# Patient Record
Sex: Male | Born: 1980 | Race: Black or African American | Hispanic: No | Marital: Single | Smoking: Former smoker
Health system: Southern US, Community
[De-identification: ages and names within clinical notes are randomized; demographics above are authoritative.]

---

## 2000-01-28 ENCOUNTER — Encounter: Admission: RE | Admit: 2000-01-28 | Discharge: 2000-01-28 | Payer: Self-pay | Admitting: Internal Medicine

## 2001-08-04 ENCOUNTER — Encounter: Admission: RE | Admit: 2001-08-04 | Discharge: 2001-08-04 | Payer: Self-pay

## 2008-01-13 ENCOUNTER — Encounter (INDEPENDENT_AMBULATORY_CARE_PROVIDER_SITE_OTHER): Payer: Self-pay | Admitting: Internal Medicine

## 2008-01-13 ENCOUNTER — Ambulatory Visit: Payer: Self-pay | Admitting: Hospitalist

## 2008-01-13 DIAGNOSIS — L738 Other specified follicular disorders: Secondary | ICD-10-CM | POA: Insufficient documentation

## 2011-09-18 ENCOUNTER — Ambulatory Visit (INDEPENDENT_AMBULATORY_CARE_PROVIDER_SITE_OTHER): Payer: Self-pay

## 2011-09-18 DIAGNOSIS — L02219 Cutaneous abscess of trunk, unspecified: Secondary | ICD-10-CM

## 2011-09-20 ENCOUNTER — Ambulatory Visit (INDEPENDENT_AMBULATORY_CARE_PROVIDER_SITE_OTHER): Payer: Self-pay

## 2011-09-20 DIAGNOSIS — L02219 Cutaneous abscess of trunk, unspecified: Secondary | ICD-10-CM

## 2011-09-23 ENCOUNTER — Ambulatory Visit: Payer: Self-pay

## 2011-09-23 DIAGNOSIS — L02219 Cutaneous abscess of trunk, unspecified: Secondary | ICD-10-CM

## 2011-09-23 DIAGNOSIS — L03319 Cellulitis of trunk, unspecified: Secondary | ICD-10-CM

## 2011-09-25 ENCOUNTER — Ambulatory Visit (INDEPENDENT_AMBULATORY_CARE_PROVIDER_SITE_OTHER): Payer: Self-pay

## 2011-09-25 DIAGNOSIS — L03319 Cellulitis of trunk, unspecified: Secondary | ICD-10-CM

## 2011-09-25 DIAGNOSIS — L02219 Cutaneous abscess of trunk, unspecified: Secondary | ICD-10-CM

## 2011-09-27 ENCOUNTER — Ambulatory Visit (INDEPENDENT_AMBULATORY_CARE_PROVIDER_SITE_OTHER): Payer: Self-pay

## 2011-09-27 DIAGNOSIS — L02219 Cutaneous abscess of trunk, unspecified: Secondary | ICD-10-CM

## 2011-09-30 ENCOUNTER — Ambulatory Visit: Payer: Self-pay

## 2011-10-01 ENCOUNTER — Ambulatory Visit (INDEPENDENT_AMBULATORY_CARE_PROVIDER_SITE_OTHER): Payer: Self-pay | Admitting: Physician Assistant

## 2011-10-01 DIAGNOSIS — L03319 Cellulitis of trunk, unspecified: Secondary | ICD-10-CM

## 2011-10-01 DIAGNOSIS — L02219 Cutaneous abscess of trunk, unspecified: Secondary | ICD-10-CM

## 2011-10-03 ENCOUNTER — Ambulatory Visit (INDEPENDENT_AMBULATORY_CARE_PROVIDER_SITE_OTHER): Payer: Self-pay | Admitting: Physician Assistant

## 2011-10-03 DIAGNOSIS — L03319 Cellulitis of trunk, unspecified: Secondary | ICD-10-CM

## 2011-10-09 ENCOUNTER — Ambulatory Visit: Payer: Self-pay

## 2011-10-11 ENCOUNTER — Ambulatory Visit (INDEPENDENT_AMBULATORY_CARE_PROVIDER_SITE_OTHER): Payer: Self-pay

## 2011-10-11 DIAGNOSIS — L02219 Cutaneous abscess of trunk, unspecified: Secondary | ICD-10-CM

## 2011-10-14 ENCOUNTER — Ambulatory Visit (INDEPENDENT_AMBULATORY_CARE_PROVIDER_SITE_OTHER): Payer: Self-pay

## 2011-10-14 DIAGNOSIS — L03319 Cellulitis of trunk, unspecified: Secondary | ICD-10-CM

## 2011-10-16 ENCOUNTER — Ambulatory Visit (INDEPENDENT_AMBULATORY_CARE_PROVIDER_SITE_OTHER): Payer: Self-pay

## 2011-10-16 DIAGNOSIS — L03319 Cellulitis of trunk, unspecified: Secondary | ICD-10-CM

## 2011-10-16 DIAGNOSIS — L02219 Cutaneous abscess of trunk, unspecified: Secondary | ICD-10-CM

## 2011-10-18 ENCOUNTER — Ambulatory Visit (INDEPENDENT_AMBULATORY_CARE_PROVIDER_SITE_OTHER): Payer: Self-pay

## 2011-10-18 DIAGNOSIS — L02219 Cutaneous abscess of trunk, unspecified: Secondary | ICD-10-CM

## 2011-10-18 DIAGNOSIS — L03319 Cellulitis of trunk, unspecified: Secondary | ICD-10-CM

## 2011-10-21 ENCOUNTER — Ambulatory Visit (INDEPENDENT_AMBULATORY_CARE_PROVIDER_SITE_OTHER): Payer: Self-pay

## 2011-10-21 DIAGNOSIS — L03319 Cellulitis of trunk, unspecified: Secondary | ICD-10-CM

## 2011-10-23 ENCOUNTER — Ambulatory Visit (INDEPENDENT_AMBULATORY_CARE_PROVIDER_SITE_OTHER): Payer: Self-pay

## 2011-10-23 DIAGNOSIS — L03319 Cellulitis of trunk, unspecified: Secondary | ICD-10-CM

## 2011-10-23 DIAGNOSIS — L02219 Cutaneous abscess of trunk, unspecified: Secondary | ICD-10-CM

## 2011-10-26 ENCOUNTER — Ambulatory Visit (INDEPENDENT_AMBULATORY_CARE_PROVIDER_SITE_OTHER): Payer: Self-pay

## 2011-10-26 DIAGNOSIS — L03319 Cellulitis of trunk, unspecified: Secondary | ICD-10-CM

## 2011-10-26 DIAGNOSIS — L02219 Cutaneous abscess of trunk, unspecified: Secondary | ICD-10-CM

## 2011-10-29 ENCOUNTER — Ambulatory Visit: Payer: Self-pay | Admitting: Physician Assistant

## 2011-10-30 ENCOUNTER — Ambulatory Visit: Payer: Self-pay

## 2011-10-30 DIAGNOSIS — L02219 Cutaneous abscess of trunk, unspecified: Secondary | ICD-10-CM

## 2011-11-01 ENCOUNTER — Ambulatory Visit (INDEPENDENT_AMBULATORY_CARE_PROVIDER_SITE_OTHER): Payer: Self-pay

## 2011-11-01 DIAGNOSIS — L02219 Cutaneous abscess of trunk, unspecified: Secondary | ICD-10-CM

## 2011-11-05 ENCOUNTER — Ambulatory Visit (INDEPENDENT_AMBULATORY_CARE_PROVIDER_SITE_OTHER): Payer: Self-pay

## 2011-11-05 DIAGNOSIS — L02219 Cutaneous abscess of trunk, unspecified: Secondary | ICD-10-CM

## 2013-08-19 ENCOUNTER — Ambulatory Visit (INDEPENDENT_AMBULATORY_CARE_PROVIDER_SITE_OTHER): Payer: Managed Care, Other (non HMO) | Admitting: Physician Assistant

## 2013-08-19 VITALS — BP 134/74 | HR 100 | Temp 98.2°F | Resp 20 | Ht 68.75 in | Wt 262.8 lb

## 2013-08-19 DIAGNOSIS — L723 Sebaceous cyst: Secondary | ICD-10-CM

## 2013-08-19 DIAGNOSIS — L089 Local infection of the skin and subcutaneous tissue, unspecified: Secondary | ICD-10-CM

## 2013-08-19 MED ORDER — TRAMADOL HCL 50 MG PO TABS: 50.0000 mg | ORAL_TABLET | Freq: Three times a day (TID) | ORAL | Status: DC | PRN

## 2013-08-19 MED ORDER — CIPROFLOXACIN HCL 250 MG PO TABS: 250.0000 mg | ORAL_TABLET | Freq: Two times a day (BID) | ORAL | Status: DC

## 2013-08-19 NOTE — Progress Notes (Signed)
Subjective:    Patient ID: Cody Best, male    DOB: 01/16/1981, 32 y.o.   MRN: [id number]  HPI    Cody Best is a very pleasant 32 yr old male here complaining of an abscess at his mid left back.  He has had this area drained twice before - recurs in the same spot every time.  Has not gotten as bad as it previously has.  First noted symptoms 5-6 days ago, worsening last couple days.  Used a heating pad to the area last night.  Has felt worse today.  No fevers or chills (had this previously with abscess.)  No other involved areas.  Admits that he has had a bump in this spot for many years prior to abscess formation  On review of paper chart pt has had this area drained twice here - both were determined to be infected sebaceous cysts.  He unfortunately had a prolonged course with second I&D as he developed a secondary infection.  Previous cultures grew peptostreptococcus and coag neg staph.  Review of Systems  Constitutional: Positive for fatigue. Negative for fever and chills.  Respiratory: Negative.   Cardiovascular: Negative.   Gastrointestinal: Negative.   Musculoskeletal: Negative.   Skin: Positive for color change.  Neurological: Negative.        Objective:   Physical Exam  Vitals reviewed. Constitutional: He is oriented to person, place, and time. He appears well-developed and well-nourished. No distress.  HENT:  Head: Normocephalic and atraumatic.  Eyes: Conjunctivae are normal. No scleral icterus.  Pulmonary/Chest: Effort normal.  Neurological: He is alert and oriented to person, place, and time.  Skin: Skin is warm and dry.     Area of induration and erythema at left flank; moderately TTP; area of fluctuance under scar from previous drainage  Psychiatric: He has a normal mood and affect. His behavior is normal.    Procedure Note: Verbal consent obtained from the patient.  Local anesthesia with 2cc 2% plain lidocaine.  Betadine prep.  Incision with 11 blade over area of  maximal fluctuance.   Scant amount of sebaceous material expressed initially, but with deeper incision copious sebaceous material and purulence drained spontaneously.  There continued to be copious drainage of both purulence and sebaceous material with deep palpation.  Curved hemostats were used to break up loculations again facilitating copious drainage of sebaceous material.  Wound extensively explored breaking up loculations and removing sebaceous material.  Large pieces of the cyst wall were removed.  Irrigated with 5cc 2% plain lidocaine x 2, each time loosening further sebaceous material. Eventually I was unable to express anything further from the area and could not remove any further pieces of cyst wall.  The area was packed with 1/4" plain packing, cleansed, and dressed.  Pt tolerated very well.         Assessment & Plan:  Infected sebaceous cyst - Plan: Wound culture, ciprofloxacin (CIPRO) 250 MG tablet, traMADol (ULTRAM) 50 MG tablet   Cody Best is a very pleasant 32 yr old male here with an infected sebaceous cyst at his mid left back.  This is the third time he has had an infected sebaceous cyst in this location.  Area incised and drained as above.  Cx collected.  Cx from two previous I&D's have grown peptostreptococcus and coag neg staph - sensitive only to fluoroquinolones, clinda, gent, and rifampin.  He was previously successfully treated with cipro.  Because of this, will go ahead and start Cipro today.  I have given him a dose from our stock.  Tramadol prn pain.  Frequent hot compresses today and tomorrow.  Daily and prn dressing changes.  RTC 48 hours for wound care.  Fast track card given.  If sebaceous cyst recurs after this I&D, may have to consider surgery eval for wider/deeper excision.   Meds ordered this encounter  Medications  . ciprofloxacin (CIPRO) 250 MG tablet    Sig: Take 1 tablet (250 mg total) by mouth 2 (two) times daily.    Dispense:  14 tablet    Refill:  0     Order Specific Question:  Supervising Provider    Answer:  Ethelda Chick [[id number]]  . traMADol (ULTRAM) 50 MG tablet    Sig: Take 1 tablet (50 mg total) by mouth every 8 (eight) hours as needed.    Dispense:  30 tablet    Refill:  0    Order Specific Question:  Supervising Provider    Answer:  Ethelda Chick [[id number]]    Loleta Dicker MHS, PA-C [facility] 11/6/20144:16 PM

## 2013-08-19 NOTE — Patient Instructions (Signed)
Frequent hot compresses tonight and tomorrow.  Change the dressing at least once per day, more frequently if the dressing becomes wet or saturated.  Take the Cipro as directed.  Tramadol if needed for pain relief.   Follow up Saturday for wound care, sooner if concerns   Epidermal Cyst An epidermal cyst is sometimes called a sebaceous cyst, epidermal inclusion cyst, or infundibular cyst. These cysts usually contain a substance that looks "pasty" or "cheesy" and may have a bad smell. This substance is a protein called keratin. Epidermal cysts are usually found on the face, neck, or trunk. They may also occur in the vaginal area or other parts of the genitalia of both men and women. Epidermal cysts are usually small, painless, slow-growing bumps or lumps that move freely under the skin. It is important not to try to pop them. This may cause an infection and lead to tenderness and swelling. CAUSES  Epidermal cysts may be caused by a deep penetrating injury to the skin or a plugged hair follicle, often associated with acne. SYMPTOMS  Epidermal cysts can become inflamed and cause:  Redness.  Tenderness.  Increased temperature of the skin over the bumps or lumps.  Grayish-white, bad smelling material that drains from the bump or lump. DIAGNOSIS  Epidermal cysts are easily diagnosed by your caregiver during an exam. Rarely, a tissue sample (biopsy) may be taken to rule out other conditions that may resemble epidermal cysts. TREATMENT   Epidermal cysts often get better and disappear on their own. They are rarely ever cancerous.  If a cyst becomes infected, it may become inflamed and tender. This may require opening and draining the cyst. Treatment with antibiotics may be necessary. When the infection is gone, the cyst may be removed with minor surgery.  Small, inflamed cysts can often be treated with antibiotics or by injecting steroid medicines.  Sometimes, epidermal cysts become large and  bothersome. If this happens, surgical removal in your caregiver's office may be necessary. HOME CARE INSTRUCTIONS  Only take over-the-counter or prescription medicines as directed by your caregiver.  Take your antibiotics as directed. Finish them even if you start to feel better. SEEK MEDICAL CARE IF:   Your cyst becomes tender, red, or swollen.  Your condition is not improving or is getting worse.  You have any other questions or concerns. MAKE SURE YOU:  Understand these instructions.  Will watch your condition.  Will get help right away if you are not doing well or get worse. Document Released: 08/31/2004 Document Revised: 12/23/2011 Document Reviewed: 04/08/2011 [facility] Patient Information 2014 [place], [place].

## 2013-08-21 ENCOUNTER — Encounter: Payer: Self-pay | Admitting: Physician Assistant

## 2013-08-21 ENCOUNTER — Ambulatory Visit (INDEPENDENT_AMBULATORY_CARE_PROVIDER_SITE_OTHER): Payer: Managed Care, Other (non HMO) | Admitting: Physician Assistant

## 2013-08-21 VITALS — BP 128/82 | HR 89 | Temp 97.8°F | Resp 18 | Ht 68.75 in | Wt 262.0 lb

## 2013-08-21 DIAGNOSIS — L089 Local infection of the skin and subcutaneous tissue, unspecified: Secondary | ICD-10-CM

## 2013-08-21 DIAGNOSIS — L723 Sebaceous cyst: Secondary | ICD-10-CM

## 2013-08-21 NOTE — Progress Notes (Signed)
Patient ID: JACERE PANGBORN MRN: [id number], DOB: 23-Apr-1981 32 y.o. Date of Encounter: 08/21/2013, 11:58 AM  Chief Complaint: Wound care   See previous note  HPI: 32 y.o. y/o male presents for wound care s/p I&D on 08/19/13.  Doing well No issues or complaints Afebrile/ no chills No nausea or vomiting Tolerating Cipro Pain improved.  Daily dressing change Previous note reviewed  History reviewed. No pertinent past medical history.   Home Meds: Prior to Admission medications   Medication Sig Start Date End Date Taking? Authorizing Provider  ciprofloxacin (CIPRO) 250 MG tablet Take 1 tablet (250 mg total) by mouth 2 (two) times daily. 08/19/13  Yes Eleanore E Debbra Riding, PA-C  traMADol (ULTRAM) 50 MG tablet Take 1 tablet (50 mg total) by mouth every 8 (eight) hours as needed. 08/19/13   Eleanore Delia Chimes, PA-C    Allergies: No Known Allergies  ROS: Constitutional: Afebrile, no chills Cardiovascular: negative for chest pain or palpitations Dermatological: Positive for wound. Negative for erythema, pain, or warmth.  GI: No nausea or vomiting   EXAM: Physical Exam:  Blood pressure 128/82, pulse 89, temperature 97.8 F (36.6 C), temperature source Oral, resp. rate 18, height 5' 8.75" (1.746 m), weight 262 lb (118.842 kg), SpO2 98.00%., Body mass index is 38.98 kg/(m^2). General: Well developed, well nourished, in no acute distress. Nontoxic appearing. Head: Normocephalic, atraumatic, sclera non-icteric.  Neck: Supple. Lungs: Breathing is unlabored. Heart: Normal rate. Skin:  Warm and moist. Dressing and packing in place. No induration, erythema, or tenderness to palpation. Neuro: Alert and oriented X 3. Moves all extremities spontaneously. Normal gait.  Psych:  Responds to questions appropriately with a normal affect.       PROCEDURE: Dressing and packing removed. Moderate amount of purulence and sebaceous material expressed Wound bed healthy Irrigated with 1% plain lidocaine 5  cc. Repacked with 1/4 plain packing Dressing applied  LAB: Culture: pending  A/P: 32 y.o. y/o male with infected sebaceous cyst of back s/p I&D on 08/19/13.  Wound care per above Continue Cipro Pain well controlled Daily dressing changes Recheck 48 hours  Signed, Rhoderick Moody, PA-C 08/21/2013 11:58 AM

## 2013-08-22 LAB — WOUND CULTURE
Gram Stain: NONE SEEN
Gram Stain: NONE SEEN
Gram Stain: NONE SEEN

## 2013-08-23 ENCOUNTER — Ambulatory Visit (INDEPENDENT_AMBULATORY_CARE_PROVIDER_SITE_OTHER): Payer: Managed Care, Other (non HMO) | Admitting: Physician Assistant

## 2013-08-23 VITALS — BP 130/78 | HR 86 | Temp 98.0°F | Resp 16

## 2013-08-23 DIAGNOSIS — L089 Local infection of the skin and subcutaneous tissue, unspecified: Secondary | ICD-10-CM

## 2013-08-23 DIAGNOSIS — L723 Sebaceous cyst: Secondary | ICD-10-CM

## 2013-08-23 NOTE — Progress Notes (Signed)
  Subjective:    Patient ID: Cody Best, male    DOB: 08-16-1981, 32 y.o.   MRN: [id number]  HPI    Cody Best is a very pleasant 32 yr old male here for wound care following I&D of an infected sebaceous cyst on 08/19/13.  He reports that he is doing well.  He has much less pain.  The area continues to drain, but "not as much as I expected."  He is taking the antibiotics and tolerating them well.  Denies NV, FC.  He is changing the dressing daily.    Review of Systems  Constitutional: Negative for fever and chills.  Respiratory: Negative.   Cardiovascular: Negative.   Gastrointestinal: Negative.   Musculoskeletal: Negative.   Skin: Positive for wound.  Neurological: Negative.        Objective:   Physical Exam  Vitals reviewed. Constitutional: He is oriented to person, place, and time. He appears well-developed and well-nourished. No distress.  HENT:  Head: Normocephalic and atraumatic.  Eyes: Conjunctivae are normal. No scleral icterus.  Pulmonary/Chest: Effort normal.  Neurological: He is alert and oriented to person, place, and time.  Skin: Skin is warm and dry.     Healing wound at left flank; no erythema or induration; nontender  Psychiatric: He has a normal mood and affect. His behavior is normal.    Wound Care: Dressing and packing removed.  Packing saturated with drainage.  Irrigated with 10cc 1% plain lidocaine.  Small amount of sebaceous material and blood expressed.  No purulence.  Repacked with 1/4" plain packing.  Dressing applied.     Assessment & Plan:  Infected sebaceous cyst   Cody Best is a very pleasant 32 yr old male here for wound care following I&D of an infected sebaceous cyst.  The area has improved greatly since initial I&D 08/19/13.  Culture with no growth despite frank purulence with I&D.  Will continue abx.  Continue daily dressing changes.  Recheck 48 hours, fast track card updated.

## 2013-08-25 ENCOUNTER — Ambulatory Visit (INDEPENDENT_AMBULATORY_CARE_PROVIDER_SITE_OTHER): Payer: Managed Care, Other (non HMO) | Admitting: Physician Assistant

## 2013-08-25 VITALS — BP 120/78 | HR 78 | Temp 98.3°F | Resp 18 | Ht 68.75 in | Wt 262.0 lb

## 2013-08-25 DIAGNOSIS — L723 Sebaceous cyst: Secondary | ICD-10-CM

## 2013-08-25 DIAGNOSIS — L089 Local infection of the skin and subcutaneous tissue, unspecified: Secondary | ICD-10-CM

## 2013-08-25 NOTE — Progress Notes (Signed)
  Subjective:    Patient ID: Cody Best, male    DOB: June 09, 1981, 32 y.o.   MRN: [id number]  HPI    [name] is a very pleasant 32 yr old male here for wound care following I&D of an infected sebaceous cyst on 08/19/13.  He reports that he continues to do well.  He has much less pain.  The area continues to drain.  He is taking the antibiotics and tolerating them well.  Denies NV, FC.  He is changing the dressing daily.   Review of Systems  Constitutional: Negative for fever and chills.  Respiratory: Negative.   Cardiovascular: Negative.   Gastrointestinal: Negative.   Musculoskeletal: Negative.   Skin: Positive for wound.  Neurological: Negative.        Objective:   Physical Exam  Vitals reviewed. Constitutional: He is oriented to person, place, and time. He appears well-developed and well-nourished. No distress.  HENT:  Head: Normocephalic and atraumatic.  Eyes: Conjunctivae are normal. No scleral icterus.  Pulmonary/Chest: Effort normal.  Neurological: He is alert and oriented to person, place, and time.  Skin: Skin is warm and dry.     Healing wound at left flank; no erythema or induration; nontender  Psychiatric: He has a normal mood and affect. His behavior is normal.    Wound Care: Dressing and packing removed.  Packing saturated with drainage.  Irrigated with 5cc 1% plain lidocaine.  Scant amount of sebaceous material expresses along with one small piece of cyst wall.  No purulence.  Repacked loosely with 1/4" plain packing.  Dressing applied.     Assessment & Plan:  Infected sebaceous cyst  [name] is a very pleasant 32 yr old male here for wound care following I&D of an infected sebaceous cyst.  The area continues to improve.  Culture with no growth despite frank purulence with I&D.  Will finish abx.  Continue daily dressing changes.  Recheck 48 hours, fast track card updated.  Loleta Dicker MHS, PA-C [facility] 11/12/20144:27 PM

## 2013-08-27 ENCOUNTER — Encounter: Payer: Self-pay | Admitting: Physician Assistant

## 2013-08-27 ENCOUNTER — Ambulatory Visit (INDEPENDENT_AMBULATORY_CARE_PROVIDER_SITE_OTHER): Payer: Managed Care, Other (non HMO) | Admitting: Physician Assistant

## 2013-08-27 VITALS — BP 128/78 | HR 74 | Temp 98.2°F | Resp 16 | Ht 68.5 in | Wt 262.0 lb

## 2013-08-27 DIAGNOSIS — L723 Sebaceous cyst: Secondary | ICD-10-CM

## 2013-08-27 DIAGNOSIS — L089 Local infection of the skin and subcutaneous tissue, unspecified: Secondary | ICD-10-CM

## 2013-08-27 NOTE — Progress Notes (Signed)
  Subjective:    Patient ID: Cody Best, male    DOB: May 13, 1981, 32 y.o.   MRN: [id number]  HPI     Mr. Teems is a very pleasant 32 yr old male here for wound care following I&D of an infected sebaceous cyst on 08/19/13.  He reports that he continues to do well.  He has much less pain.  The area continues to drain.  He is taking the antibiotics and tolerating them well.  Denies NV, FC.  He is changing the dressing daily.   Review of Systems  Constitutional: Negative for fever and chills.  Respiratory: Negative.   Cardiovascular: Negative.   Gastrointestinal: Negative.   Musculoskeletal: Negative.   Skin: Positive for wound.  Neurological: Negative.        Objective:   Physical Exam  Vitals reviewed. Constitutional: He is oriented to person, place, and time. He appears well-developed and well-nourished. No distress.  HENT:  Head: Normocephalic and atraumatic.  Eyes: Conjunctivae are normal. No scleral icterus.  Pulmonary/Chest: Effort normal.  Neurological: He is alert and oriented to person, place, and time.  Skin: Skin is warm and dry.     Healing wound at left flank; no erythema or induration; nontender  Psychiatric: He has a normal mood and affect. His behavior is normal.    Wound Care: Dressing and packing removed.  Packing saturated with drainage. Scant amount of sebaceous material expressed along with one small piece of cyst wall.  No purulence.  Repacked loosely with 1/4" plain packing.  Dressing applied.     Assessment & Plan:  Infected sebaceous cyst  Mr. Oki is a very pleasant 32 yr old male here for wound care following I&D of an infected sebaceous cyst.  The area continues to improve.  I have repacked the area loosely today but do not think he will require further packing.  Will have him remove the packing on 08/30/13.  Continue daily dressing changes.  Keep area covered until completely healed.  RTC if concerns.  If sebaceous cyst recurs (which it has twice  before) discussed the possibility of excising it here or referral to surgery to evaluate excision.    Loleta Dicker MHS, PA-C [facility] 11/14/20146:53 PM

## 2013-10-02 ENCOUNTER — Telehealth: Payer: Self-pay

## 2013-10-02 DIAGNOSIS — L723 Sebaceous cyst: Secondary | ICD-10-CM

## 2013-10-02 NOTE — Telephone Encounter (Signed)
Cody Best - Pt has been seen several times for a recurring cyst.  He says the last person he saw told him that if it came back again that he would be referred.  It has came back.  Right now he can stand it.  I told him if it got unbearable to please come back to be seen.  Please call asap.  [phone]

## 2013-10-03 NOTE — Telephone Encounter (Signed)
He has to be seen to get referred? Please advise

## 2013-10-03 NOTE — Telephone Encounter (Signed)
Referral made. Return here for evaluation if acutely worsening before surgery appt

## 2013-10-03 NOTE — Telephone Encounter (Signed)
Spoke with pt, advised referral made. Pt understood.

## 2013-10-05 ENCOUNTER — Encounter (INDEPENDENT_AMBULATORY_CARE_PROVIDER_SITE_OTHER): Payer: Self-pay | Admitting: Surgery

## 2013-10-05 ENCOUNTER — Ambulatory Visit (INDEPENDENT_AMBULATORY_CARE_PROVIDER_SITE_OTHER): Payer: Managed Care, Other (non HMO) | Admitting: Surgery

## 2013-10-05 VITALS — BP 132/70 | HR 76 | Temp 98.0°F | Resp 18 | Ht 69.0 in | Wt 253.8 lb

## 2013-10-05 DIAGNOSIS — L720 Epidermal cyst: Secondary | ICD-10-CM

## 2013-10-05 DIAGNOSIS — L723 Sebaceous cyst: Secondary | ICD-10-CM

## 2013-10-05 NOTE — Progress Notes (Signed)
Chief Complaint:  Recurrent cyst on left back x 4  History of Present Illness:  Cody Best is an 32 y.o. male who has had a recurrent infected cyst on his left back [facility] at [facility] x 4.  Wants it excised.    No past medical history on file.  No past surgical history on file.  Current Outpatient Prescriptions  Medication Sig Dispense Refill  . ciprofloxacin (CIPRO) 250 MG tablet Take 1 tablet (250 mg total) by mouth 2 (two) times daily.  14 tablet  0  . traMADol (ULTRAM) 50 MG tablet Take 1 tablet (50 mg total) by mouth every 8 (eight) hours as needed.  30 tablet  0   No current facility-administered medications for this visit.   Review of patient's allergies indicates no known allergies. Family History  Problem Relation Age of Onset  . Diabetes Mother   . Hyperlipidemia Mother   . Heart disease Mother   . Diabetes Maternal Grandmother   . Hyperlipidemia Maternal Grandmother   . Heart disease Maternal Grandmother    Social History:   reports that he has been smoking.  He does not have any smokeless tobacco history on file. He reports that he drinks about 0.5 ounces of alcohol per week. He reports that he does not use illicit drugs.   REVIEW OF SYSTEMS - PERTINENT POSITIVES ONLY: No positives  Physical Exam:   Blood pressure 132/70, pulse 76, temperature 98 F (36.7 C), resp. rate 18, height 5\' 9"  (1.753 m), weight 253 lb 12.8 oz (115.123 kg). Body mass index is 37.46 kg/(m^2).  Gen:  WDWN AAM NAD  Neurological: Alert and oriented to person, place, and time. Motor and sensory function is grossly intact  Head: Normocephalic and atraumatic.  Eyes: Conjunctivae are normal. Pupils are equal, round, and reactive to light. No scleral icterus.  Neck: Normal range of motion. Neck supple. No tracheal deviation or thyromegaly present.   Pscyh: Normal mood and affect. Behavior is normal. Judgment and thought content normal.   Left back:  There is a grey nodule consisting of  scar over an old 2 cm palpable sebaceous cyst.    LABORATORY RESULTS: No results found for this or any previous visit (from the past 48 hour(s)).  RADIOLOGY RESULTS: No results found.  Problem List: Patient Active Problem List   Diagnosis Date Noted  . FOLLICULITIS 01/13/2008    Assessment & Plan: Sebaceous cyst-recurrent Excision under local in minor room at [facility]    Matt B. Daphine Deutscher, MD, [facility] [phone] beeper [phone]  10/05/2013 4:10 PM

## 2013-10-05 NOTE — Patient Instructions (Signed)

## 2013-11-08 ENCOUNTER — Telehealth (INDEPENDENT_AMBULATORY_CARE_PROVIDER_SITE_OTHER): Payer: Self-pay | Admitting: General Surgery

## 2013-11-08 NOTE — Telephone Encounter (Signed)
Pt called to ask about whether to proceed with surgery to removal a cyst.  With discussion, he admits this a recurrent cyst, but is currently dormant.  Encouraged pt to get the cyst removed as planned to avoid any further problems or flare-ups again. He agrees.

## 2013-11-12 ENCOUNTER — Encounter (HOSPITAL_BASED_OUTPATIENT_CLINIC_OR_DEPARTMENT_OTHER)
Admission: RE | Disposition: A | Discharge status: Home / Self Care | Payer: Self-pay | Source: Ambulatory Visit | Attending: Surgery

## 2013-11-12 ENCOUNTER — Encounter (HOSPITAL_BASED_OUTPATIENT_CLINIC_OR_DEPARTMENT_OTHER): Payer: Self-pay | Admitting: *Deleted

## 2013-11-12 ENCOUNTER — Ambulatory Visit (HOSPITAL_BASED_OUTPATIENT_CLINIC_OR_DEPARTMENT_OTHER)
Admission: RE | Admit: 2013-11-12 | Discharge: 2013-11-12 | Disposition: A | Discharge status: Home / Self Care | Payer: Managed Care, Other (non HMO) | Source: Ambulatory Visit | Attending: Surgery | Admitting: Surgery

## 2013-11-12 DIAGNOSIS — L723 Sebaceous cyst: Secondary | ICD-10-CM

## 2013-11-12 HISTORY — PX: CYST REMOVAL TRUNK: SHX6283

## 2013-11-12 SURGERY — CYST REMOVAL TRUNK
Anesthesia: LOCAL | Site: Back | Laterality: Left

## 2013-11-12 MED ORDER — CEFAZOLIN SODIUM-DEXTROSE 2-3 GM-% IV SOLR: 2.0000 g | INTRAVENOUS | Status: DC

## 2013-11-12 MED ORDER — SODIUM BICARBONATE 4 % IV SOLN
INTRAVENOUS | Status: DC | PRN
  Administered 2013-11-12: 5 mL via INTRAVENOUS

## 2013-11-12 MED ORDER — LIDOCAINE-EPINEPHRINE (PF) 1 %-1:200000 IJ SOLN
INTRAMUSCULAR | Status: DC | PRN
  Administered 2013-11-12: 10 mL

## 2013-11-12 MED ORDER — CHLORHEXIDINE GLUCONATE 4 % EX LIQD: 1.0000 | Freq: Once | CUTANEOUS | Status: DC

## 2013-11-12 SURGICAL SUPPLY — 27 items
BENZOIN TINCTURE PRP APPL 2/3 (GAUZE/BANDAGES/DRESSINGS) IMPLANT
BLADE SURG 15 STRL LF DISP TIS (BLADE) ×1 IMPLANT
BLADE SURG 15 STRL SS (BLADE) ×1
BLADE SURG ROTATE 9660 (MISCELLANEOUS) IMPLANT
DECANTER SPIKE VIAL GLASS SM (MISCELLANEOUS) ×2 IMPLANT
DERMABOND ADVANCED (GAUZE/BANDAGES/DRESSINGS) ×1
DERMABOND ADVANCED .7 DNX12 (GAUZE/BANDAGES/DRESSINGS) ×1 IMPLANT
ELECT REM PT RETURN 9FT ADLT (ELECTROSURGICAL) ×2
ELECTRODE REM PT RTRN 9FT ADLT (ELECTROSURGICAL) ×1 IMPLANT
GAUZE SPONGE 4X4 16PLY XRAY LF (GAUZE/BANDAGES/DRESSINGS) ×2 IMPLANT
GLOVE BIO SURGEON STRL SZ8 (GLOVE) ×2 IMPLANT
GLOVE ECLIPSE 6.5 STRL STRAW (GLOVE) ×2 IMPLANT
GLOVE ECLIPSE 7.0 STRL STRAW (GLOVE) ×2 IMPLANT
NEEDLE 27GAX1X1/2 (NEEDLE) IMPLANT
NEEDLE HYPO 25X1 1.5 SAFETY (NEEDLE) IMPLANT
PENCIL BUTTON HOLSTER BLD 10FT (ELECTRODE) ×2 IMPLANT
SPONGE GAUZE 4X4 12PLY STER LF (GAUZE/BANDAGES/DRESSINGS) ×2 IMPLANT
STRIP CLOSURE SKIN 1/2X4 (GAUZE/BANDAGES/DRESSINGS) IMPLANT
SUT ETHILON 3 0 FSL (SUTURE) IMPLANT
SUT ETHILON 5 0 PS 2 18 (SUTURE) IMPLANT
SUT VIC AB 4-0 SH 18 (SUTURE) IMPLANT
SUT VIC AB 5-0 PS2 18 (SUTURE) IMPLANT
SUT VICRYL 3-0 CR8 SH (SUTURE) ×2 IMPLANT
SWABSTICK POVIDONE IODINE SNGL (MISCELLANEOUS) ×4 IMPLANT
SYR CONTROL 10ML LL (SYRINGE) ×2 IMPLANT
TOWEL OR 17X24 6PK STRL BLUE (TOWEL DISPOSABLE) IMPLANT
UNDERPAD 30X30 INCONTINENT (UNDERPADS AND DIAPERS) IMPLANT

## 2013-11-12 NOTE — Op Note (Signed)
Surgeon: Wenda LowMatt Dotty Gonzalo, MD, FACS  Asst:  none  Anes:  Local 1% lido with epi and neut  Procedure: Excision of 2 cm chronically inflamed sebaceous cyst of the left upper back3  Diagnosis: Sebaceous cyst (path pending)  Complications: none  EBL:   3 cc  Description of Procedure:  In room 2 at [facility] the back was prepped with betadine and draped.  A timeout was performed.  Infiltration with 31 gauge needle provided good anesthesia (9cc).   A wide ellipse was described and this was taken deep to the deep fat/fascia interface in order to resect the cyst in toto.  This was done and the wound was closed in multiple layers with 3-0 vicryl and Dermabond.    Matt B. Daphine DeutscherMartin, MD, [facility]FACS [facility] [place] [phone]

## 2013-11-12 NOTE — Discharge Instructions (Signed)

## 2013-11-15 ENCOUNTER — Encounter (HOSPITAL_BASED_OUTPATIENT_CLINIC_OR_DEPARTMENT_OTHER): Payer: Self-pay | Admitting: Surgery

## 2013-11-16 ENCOUNTER — Telehealth (INDEPENDENT_AMBULATORY_CARE_PROVIDER_SITE_OTHER): Payer: Self-pay | Admitting: General Surgery

## 2013-11-16 NOTE — Telephone Encounter (Signed)
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  He had a cyst removed from back.  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  No core or weights for this time.  When he returns to core activity, be aware of any pain, sense of pulling or strain at the site and stop or lessen that activity.  He understands all and will comply.  He aunt is a Engineer, civil (consulting)nurse and is monitoring the surgical site for him.

## 2013-12-20 ENCOUNTER — Encounter (INDEPENDENT_AMBULATORY_CARE_PROVIDER_SITE_OTHER): Payer: Managed Care, Other (non HMO) | Admitting: Surgery

## 2013-12-23 ENCOUNTER — Encounter (INDEPENDENT_AMBULATORY_CARE_PROVIDER_SITE_OTHER): Payer: Managed Care, Other (non HMO) | Admitting: Surgery

## 2013-12-30 ENCOUNTER — Encounter (INDEPENDENT_AMBULATORY_CARE_PROVIDER_SITE_OTHER): Payer: Self-pay | Admitting: Surgery

## 2017-11-20 ENCOUNTER — Encounter: Payer: Self-pay | Admitting: Physician Assistant

## 2017-11-20 ENCOUNTER — Ambulatory Visit: Payer: Self-pay | Admitting: Physician Assistant

## 2017-11-20 VITALS — BP 117/76 | HR 91 | Temp 98.2°F | Resp 16 | Ht 68.5 in | Wt 236.0 lb

## 2017-11-20 DIAGNOSIS — Z024 Encounter for examination for driving license: Secondary | ICD-10-CM

## 2017-11-20 NOTE — Patient Instructions (Signed)
     IF you received an x-ray today, you will receive an invoice from [facility]. Please contact [facility] at [phone] with questions or concerns regarding your invoice.   IF you received labwork today, you will receive an invoice from [facility]. Please contact [facility] at [phone] with questions or concerns regarding your invoice.   Our billing staff will not be able to assist you with questions regarding bills from these companies.  You will be contacted with the lab results as soon as they are available. The fastest way to get your results is to activate your My Chart account. Instructions are located on the last page of this paperwork. If you have not heard from us regarding the results in 2 weeks, please contact this office.     

## 2017-11-25 ENCOUNTER — Other Ambulatory Visit: Payer: Self-pay

## 2017-11-25 ENCOUNTER — Encounter (HOSPITAL_COMMUNITY): Payer: Self-pay | Admitting: Emergency Medicine

## 2017-11-25 ENCOUNTER — Emergency Department (HOSPITAL_COMMUNITY): Payer: No Typology Code available for payment source

## 2017-11-25 ENCOUNTER — Emergency Department (HOSPITAL_COMMUNITY)
Admission: EM | Admit: 2017-11-25 | Discharge: 2017-11-26 | Disposition: A | Discharge status: Home / Self Care | Payer: No Typology Code available for payment source | Attending: Emergency Medicine | Admitting: Emergency Medicine

## 2017-11-25 DIAGNOSIS — F17210 Nicotine dependence, cigarettes, uncomplicated: Secondary | ICD-10-CM | POA: Diagnosis not present

## 2017-11-25 DIAGNOSIS — M545 Low back pain, unspecified: Secondary | ICD-10-CM

## 2017-11-25 DIAGNOSIS — M25511 Pain in right shoulder: Secondary | ICD-10-CM

## 2017-11-25 DIAGNOSIS — Z79899 Other long term (current) drug therapy: Secondary | ICD-10-CM | POA: Insufficient documentation
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
## 2017-11-26 MED ORDER — METHOCARBAMOL 500 MG PO TABS: 500.0000 mg | ORAL_TABLET | Freq: Two times a day (BID) | ORAL | 0 refills | Status: DC

## 2017-11-26 MED ORDER — NAPROXEN 375 MG PO TABS: 375.0000 mg | ORAL_TABLET | Freq: Two times a day (BID) | ORAL | 0 refills | Status: DC
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
## 2017-12-05 NOTE — Progress Notes (Signed)
[facility] [address], [place] [id number] [phone]  Date:  11/20/2017   Name:  Cody Best   DOB:  05/31/1981   MRN:  [id number]  PCP:  Patient, No Pcp Per    History of Present Illness:  Cody Best is a 37 y.o. male patient who presents to PCP with DOT  No concerns or complaints at this time  Patient Active Problem List   Diagnosis Date Noted  . FOLLICULITIS 01/13/2008    No past medical history on file.  Past Surgical History:  Procedure Laterality Date  . CYST REMOVAL TRUNK Left 11/12/2013   Procedure: MINOR CYST REMOVAL TRUNK;  Surgeon: Valarie MerinoMatthew B Martin, MD;  Location: [facility];  Service: General;  Laterality: Left;    Social History   Tobacco Use  . Smoking status: Current Every Day Smoker    Packs/day: 0.50    Types: Cigarettes  . Smokeless tobacco: Never Used  Substance Use Topics  . Alcohol use: Yes    Alcohol/week: 0.5 oz    Types: 1 Standard drinks or equivalent per week  . Drug use: No    Family History  Problem Relation Age of Onset  . Diabetes Mother   . Hyperlipidemia Mother   . Heart disease Mother   . Diabetes Maternal Grandmother   . Hyperlipidemia Maternal Grandmother   . Heart disease Maternal Grandmother     No Known Allergies  Medication list has been reviewed and updated.  Current Outpatient Medications on File Prior to Visit  Medication Sig Dispense Refill  . traMADol (ULTRAM) 50 MG tablet Take 1 tablet (50 mg total) by mouth every 8 (eight) hours as needed. (Patient not taking: Reported on 11/20/2017) 30 tablet 0   No current facility-administered medications on file prior to visit.     ROS ROS otherwise unremarkable unless listed above.  Physical Examination: BP 117/76   Pulse 91   Temp 98.2 F (36.8 C) (Oral)   Resp 16   Ht 5' 8.5" (1.74 m)   Wt 236 lb (107 kg)   SpO2 98%   BMI 35.36 kg/m  Ideal Body Weight: Weight in (lb) to have BMI = 25: 166.5  Physical Exam   Constitutional: He is oriented to person, place, and time. He appears well-developed and well-nourished. No distress.  HENT:  Head: Normocephalic and atraumatic.  Right Ear: Tympanic membrane, external ear and ear canal normal.  Left Ear: Tympanic membrane, external ear and ear canal normal.  Eyes: Conjunctivae and EOM are normal. Pupils are equal, round, and reactive to light.  Cardiovascular: Normal rate and regular rhythm. Exam reveals no friction rub.  No murmur heard. Pulmonary/Chest: Effort normal. No respiratory distress. He has no wheezes.  Abdominal: Soft. Bowel sounds are normal. He exhibits no distension and no mass. There is no tenderness. A hernia is present. Hernia confirmed negative in the right inguinal area and confirmed negative in the left inguinal area.  Musculoskeletal: Normal range of motion. He exhibits no edema or tenderness.  Neurological: He is alert and oriented to person, place, and time. He displays normal reflexes.  Skin: Skin is warm and dry. He is not diaphoretic.  Psychiatric: He has a normal mood and affect. His behavior is normal.     Assessment and Plan: Cody Best is a 37 y.o. 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

## 2017-12-09 ENCOUNTER — Ambulatory Visit: Payer: Self-pay | Admitting: Family Medicine

## 2018-01-21 ENCOUNTER — Encounter: Payer: Self-pay | Admitting: Physician Assistant

## 2019-06-09 ENCOUNTER — Ambulatory Visit
Admission: EM | Admit: 2019-06-09 | Discharge: 2019-06-09 | Disposition: A | Discharge status: Home / Self Care | Payer: Self-pay | Attending: Physician Assistant | Admitting: Physician Assistant

## 2019-06-09 DIAGNOSIS — L723 Sebaceous cyst: Secondary | ICD-10-CM

## 2019-06-09 DIAGNOSIS — L089 Local infection of the skin and subcutaneous tissue, unspecified: Secondary | ICD-10-CM

## 2019-06-09 MED ORDER — CEPHALEXIN 500 MG PO CAPS: 500.0000 mg | ORAL_CAPSULE | Freq: Four times a day (QID) | ORAL | 0 refills | Status: DC

## 2019-06-09 NOTE — Discharge Instructions (Signed)
Start keflex as directed. You can remove current dressing in 24 hours. Keep wound clean and dry. You can clean gently with soap and water. Do not soak area in water. Monitor for spreading redness, increased warmth, increased swelling, fever, follow up for reevaluation needed. 

## 2019-06-09 NOTE — ED Provider Notes (Signed)
[facility]    CSN: [id number] Arrival date & time: 06/09/19  1912      History   Chief Complaint Chief Complaint  Patient presents with  . Abscess    HPI Cody Best is a 38 y.o. male.   38 year old male comes in for 4-5 day history of abscess to the right mid back. He had history of cyst to the same area and was removed surgically 3 years ago. Few months ago noticed the cyst returning, though smaller in size. Now with redness, warmth, and area tender to touch. Unknown spreading due to location. No fever, chills, body aches. Has not taken anything for the symptoms.      History reviewed. No pertinent past medical history.  Patient Active Problem List   Diagnosis Date Noted  . FOLLICULITIS 63/78/5885    Past Surgical History:  Procedure Laterality Date  . CYST REMOVAL TRUNK Left 11/12/2013   Procedure: MINOR CYST REMOVAL TRUNK;  Surgeon: Pedro Earls, MD;  Location: [facility] [place];  Service: General;  Laterality: Left;       Home Medications    Prior to Admission medications   Medication Sig Start Date End Date Taking? Authorizing Provider  cephALEXin (KEFLEX) 500 MG capsule Take 1 capsule (500 mg total) by mouth 4 (four) times daily. 06/09/19   Ok Edwards, PA-C    Family History Family History  Problem Relation Age of Onset  . Diabetes Mother   . Hyperlipidemia Mother   . Heart disease Mother   . Diabetes Maternal Grandmother   . Hyperlipidemia Maternal Grandmother   . Heart disease Maternal Grandmother     Social History Social History   Tobacco Use  . Smoking status: Current Every Day Smoker    Packs/day: 0.50    Types: Cigarettes  . Smokeless tobacco: Never Used  Substance Use Topics  . Alcohol use: Yes    Alcohol/week: 1.0 standard drinks    Types: 1 Standard drinks or equivalent per week  . Drug use: No     Allergies   Patient has no known allergies.   Review of Systems Review of Systems  Reason unable to  perform ROS: See HPI as above.     Physical Exam Triage Vital Signs ED Triage Vitals [06/09/19 1920]  Enc Vitals Group     BP (!) 140/92     Pulse Rate 95     Resp 18     Temp 98.6 F (37 C)     Temp Source Oral     SpO2 97 %     Weight      Height      Head Circumference      Peak Flow      Pain Score 6     Pain Loc      Pain Edu?      Excl. in [place]?    No data found.  Updated Vital Signs BP (!) 140/92 (BP Location: Left Arm)   Pulse 95   Temp 98.6 F (37 C) (Oral)   Resp 18   SpO2 97%   Physical Exam Constitutional:      General: He is not in acute distress.    Appearance: He is well-developed. He is not diaphoretic.  HENT:     Head: Normocephalic and atraumatic.  Eyes:     Conjunctiva/sclera: Conjunctivae normal.     Pupils: Pupils are equal, round, and reactive to light.  Pulmonary:  Effort: Pulmonary effort is normal. No respiratory distress.  Skin:    General: Skin is warm and dry.     Comments: 6cm x 5cm abscess to the right mid back with surrounding cellulitis. Old surgical scar to the right of the abscess noted.   Neurological:     Mental Status: He is alert and oriented to person, place, and time.      UC Treatments / Results  Labs (all labs ordered are listed, but only abnormal results are displayed) Labs Reviewed - No data to display  EKG   Radiology No results found.  Procedures Incision and Drainage  Date/Time: 06/09/2019 7:56 PM Performed by: Belinda FisherYu, Amy V, PA-C Authorized by: Belinda FisherYu, Amy V, PA-C   Consent:    Consent obtained:  Verbal   Consent given by:  Patient   Risks discussed:  Bleeding, incomplete drainage, pain, damage to other organs and infection   Alternatives discussed:  Alternative treatment and referral Location:    Type:  Abscess   Size:  6cm x 5cm   Location:  Trunk   Trunk location:  Back Pre-procedure details:    Skin preparation:  Chloraprep Anesthesia (see MAR for exact dosages):    Anesthesia method:   Local infiltration   Local anesthetic:  Lidocaine 2% WITH epi Procedure type:    Complexity:  Simple Procedure details:    Needle aspiration: no     Incision types:  Single straight   Incision depth:  Dermal   Scalpel blade:  11   Wound management:  Probed and deloculated and irrigated with saline   Drainage:  Purulent   Drainage amount:  Copious   Wound treatment:  Wound left open   Packing materials:  None Post-procedure details:    Patient tolerance of procedure:  Tolerated well, no immediate complications   (including critical care time)  Medications Ordered in UC Medications - No data to display  Initial Impression / Assessment and Plan / UC Course  I have reviewed the triage vital signs and the nursing notes.  Pertinent labs & imaging results that were available during my care of the patient were reviewed by me and considered in my medical decision making (see chart for details).    Patient tolerated procedure well. Start keflex for surrounding cellulitis. Wound care instructions given. Return precautions given. Patient expresses understanding and agrees to plan.    Final Clinical Impressions(s) / UC Diagnoses   Final diagnoses:  Infected sebaceous cyst    ED Prescriptions    Medication Sig Dispense Auth. Provider   cephALEXin (KEFLEX) 500 MG capsule Take 1 capsule (500 mg total) by mouth 4 (four) times daily. 28 capsule Threasa AlphaYu, Amy V, PA-C       Yu, Amy V, [place]-C 06/09/19 [id number]

## 2019-06-09 NOTE — ED Triage Notes (Signed)
Pt c/o abscess to rt mid back area for 4-5 days. States has had surgery to remove it 3 yrs ago. Raised red area noted with tender to touch

## 2019-08-19 ENCOUNTER — Emergency Department (HOSPITAL_COMMUNITY): Payer: Self-pay

## 2019-08-19 ENCOUNTER — Encounter (HOSPITAL_COMMUNITY): Payer: Self-pay | Admitting: Emergency Medicine

## 2019-08-19 ENCOUNTER — Emergency Department (HOSPITAL_COMMUNITY)
Admission: EM | Admit: 2019-08-19 | Discharge: 2019-08-19 | Disposition: A | Discharge status: Home / Self Care | Payer: Self-pay | Attending: Emergency Medicine | Admitting: Emergency Medicine

## 2019-08-19 ENCOUNTER — Other Ambulatory Visit: Payer: Self-pay

## 2019-08-19 DIAGNOSIS — R079 Chest pain, unspecified: Secondary | ICD-10-CM

## 2019-08-19 DIAGNOSIS — Z6837 Body mass index (BMI) 37.0-37.9, adult: Secondary | ICD-10-CM | POA: Insufficient documentation

## 2019-08-19 DIAGNOSIS — R519 Headache, unspecified: Secondary | ICD-10-CM | POA: Insufficient documentation

## 2019-08-19 DIAGNOSIS — R0789 Other chest pain: Secondary | ICD-10-CM | POA: Insufficient documentation

## 2019-08-19 DIAGNOSIS — E669 Obesity, unspecified: Secondary | ICD-10-CM | POA: Insufficient documentation

## 2019-08-19 DIAGNOSIS — R531 Weakness: Secondary | ICD-10-CM | POA: Insufficient documentation

## 2019-08-19 DIAGNOSIS — R0602 Shortness of breath: Secondary | ICD-10-CM

## 2019-08-19 DIAGNOSIS — U071 COVID-19: Secondary | ICD-10-CM | POA: Insufficient documentation

## 2019-08-19 DIAGNOSIS — F17210 Nicotine dependence, cigarettes, uncomplicated: Secondary | ICD-10-CM | POA: Insufficient documentation

## 2019-08-19 LAB — BASIC METABOLIC PANEL
Anion gap: 11 (ref 5–15)
BUN: 8 mg/dL (ref 6–20)
CO2: 22 mmol/L (ref 22–32)
Calcium: 9.5 mg/dL (ref 8.9–10.3)
Chloride: 104 mmol/L (ref 98–111)
Creatinine, Ser: 1.38 mg/dL — ABNORMAL HIGH (ref 0.61–1.24)
GFR calc Af Amer: >60 mL/min (ref >60)
GFR calc non Af Amer: >60 mL/min (ref >60)
Glucose, Bld: 127 mg/dL — ABNORMAL HIGH (ref 70–99)
Potassium: 3.5 mmol/L (ref 3.5–5.1)
Sodium: 137 mmol/L (ref 135–145)

## 2019-08-19 LAB — TROPONIN I (HIGH SENSITIVITY)
Troponin I (High Sensitivity): 7 ng/L (ref <18)
Troponin I (High Sensitivity): 7 ng/L (ref <18)

## 2019-08-19 LAB — CBC
HCT: 47.1 % (ref 39.0–52.0)
Hemoglobin: 16.2 g/dL (ref 13.0–17.0)
MCH: 30.3 pg (ref 26.0–34.0)
MCHC: 34.4 g/dL (ref 30.0–36.0)
MCV: 88.2 fL (ref 80.0–100.0)
Platelets: 202 10*3/uL (ref 150–400)
RBC: 5.34 MIL/uL (ref 4.22–5.81)
RDW: 13.2 % (ref 11.5–15.5)
WBC: 8.4 10*3/uL (ref 4.0–10.5)
nRBC: 0 % (ref 0.0–0.2)

## 2019-08-19 LAB — D-DIMER, QUANTITATIVE: D-Dimer, Quant: <0.27 ug/mL-FEU (ref 0.00–0.50)

## 2019-08-19 MED ORDER — BENZONATATE 100 MG PO CAPS: 100.0000 mg | ORAL_CAPSULE | Freq: Three times a day (TID) | ORAL | 0 refills | Status: DC

## 2019-08-19 MED ORDER — IOHEXOL 350 MG/ML SOLN
100.0000 mL | Freq: Once | INTRAVENOUS | Status: AC | PRN
  Administered 2019-08-19: 100 mL via INTRAVENOUS

## 2019-08-19 MED ORDER — SODIUM CHLORIDE 0.9% FLUSH: 3.0000 mL | Freq: Once | INTRAVENOUS | Status: DC

## 2019-08-19 NOTE — ED Notes (Signed)
Patient verbalizes understanding of discharge instructions. Opportunity for questioning and answers were provided. Armband removed by staff, pt discharged from ED ambulatory to home.  

## 2019-08-19 NOTE — Discharge Instructions (Signed)
There is no evidence of heart attack or blood clot in the lung. Your chest pain is likely secondary to your coughing and illness. As we discussed, keep yourself quarantined at home while you are feeling ill. Return to the ED if you develop new or worsening symptoms.

## 2019-08-19 NOTE — ED Provider Notes (Signed)
Ct scan without acute findings.  Pt without oxygen requirement.  Appears stable for continued outpatient management.  Warning signs , precautions discussed.    Dorie Rank, MD 08/19/19 [place]ly [place]

## 2019-08-19 NOTE — ED Provider Notes (Signed)
[facility] Provider Note   CSN: [id number] Arrival date & time: 08/19/19  1125     History   Chief Complaint Chief Complaint  Patient presents with  . Chest Pain    positive COVID    HPI Lyda JesterBryan E Pantoja is a 38 y.o. male.     Patient reports tested for COVID 2 days ago due to exposure and tested positive today. He did not have any symptoms initially but now has had R sided chest pain for the past 2 days that is intermittent but persistent. Pain lasts for several hours at a time, better with some position changes. Does not radiate, somewhat worse with deep breathing. Denies any SOB. Some cough, chills, subjective fever. Chest pain is not exertional. No CAD history. Does smoke tobacco but no drugs. Also has worsening fatigue and headache. No chest pain currently but does have some pain with breathing.   The history is provided by the patient.  Chest Pain Associated symptoms: cough, fatigue, fever, headache, shortness of breath and weakness   Associated symptoms: no abdominal pain, no nausea and no vomiting     History reviewed. No pertinent past medical history.  Patient Active Problem List   Diagnosis Date Noted  . FOLLICULITIS 01/13/2008    Past Surgical History:  Procedure Laterality Date  . CYST REMOVAL TRUNK Left 11/12/2013   Procedure: MINOR CYST REMOVAL TRUNK;  Surgeon: Valarie MerinoMatthew B Martin, MD;  Location: [facility];  Service: General;  Laterality: Left;        Home Medications    Prior to Admission medications   Medication Sig Start Date End Date Taking? Authorizing Provider  cephALEXin (KEFLEX) 500 MG capsule Take 1 capsule (500 mg total) by mouth 4 (four) times daily. 06/09/19   Belinda FisherYu, Amy V, PA-C    Family History Family History  Problem Relation Age of Onset  . Diabetes Mother   . Hyperlipidemia Mother   . Heart disease Mother   . Diabetes Maternal Grandmother   . Hyperlipidemia Maternal Grandmother   .  Heart disease Maternal Grandmother     Social History Social History   Tobacco Use  . Smoking status: Current Every Day Smoker    Packs/day: 0.50    Types: Cigarettes  . Smokeless tobacco: Never Used  Substance Use Topics  . Alcohol use: Yes    Alcohol/week: 1.0 standard drinks    Types: 1 Standard drinks or equivalent per week  . Drug use: No     Allergies   Patient has no known allergies.   Review of Systems Review of Systems  Constitutional: Positive for chills, fatigue and fever.  Respiratory: Positive for cough, chest tightness and shortness of breath.   Cardiovascular: Positive for chest pain.  Gastrointestinal: Negative for abdominal pain, nausea and vomiting.  Genitourinary: Negative for dysuria and hematuria.  Musculoskeletal: Positive for arthralgias and myalgias.  Skin: Negative for rash.  Neurological: Positive for weakness and headaches.   all other systems are negative except as noted in the HPI and PMH.     Physical Exam Updated Vital Signs BP 137/69   Pulse (!) 101   Temp 99.1 F (37.3 C) (Oral)   Resp (!) 21   Ht 5\' 9"  (1.753 m)   Wt 115.7 kg   SpO2 96%   BMI 37.66 kg/m   Physical Exam Vitals signs and nursing note reviewed.  Constitutional:      General: He is not in acute distress.  Appearance: He is well-developed. He is obese. He is not ill-appearing.  HENT:     Head: Normocephalic and atraumatic.     Mouth/Throat:     Pharynx: No oropharyngeal exudate.  Eyes:     Conjunctiva/sclera: Conjunctivae normal.     Pupils: Pupils are equal, round, and reactive to light.  Neck:     Musculoskeletal: Normal range of motion and neck supple.     Comments: No meningismus. Cardiovascular:     Rate and Rhythm: Normal rate and regular rhythm.     Heart sounds: Normal heart sounds. No murmur.  Pulmonary:     Effort: Pulmonary effort is normal. No respiratory distress.     Breath sounds: Normal breath sounds.     Comments: No chest wall  tenderness Chest:     Chest wall: No tenderness.  Abdominal:     Palpations: Abdomen is soft.     Tenderness: There is no abdominal tenderness. There is no guarding or rebound.  Musculoskeletal: Normal range of motion.        General: No tenderness.  Skin:    General: Skin is warm.     Capillary Refill: Capillary refill takes less than 2 seconds.  Neurological:     General: No focal deficit present.     Mental Status: He is alert and oriented to person, place, and time. Mental status is at baseline.     Cranial Nerves: No cranial nerve deficit.     Motor: No abnormal muscle tone.     Coordination: Coordination normal.     Comments: No ataxia on finger to nose bilaterally. No pronator drift. 5/5 strength throughout. CN 2-12 intact.Equal grip strength. Sensation intact.   Psychiatric:        Behavior: Behavior normal.      ED Treatments / Results  Labs (all labs ordered are listed, but only abnormal results are displayed) Labs Reviewed  BASIC METABOLIC PANEL - Abnormal; Notable for the following components:      Result Value   Glucose, Bld 127 (*)    Creatinine, Ser 1.38 (*)    All other components within normal limits  CBC  D-DIMER, QUANTITATIVE (NOT AT [facility])  TROPONIN I (HIGH SENSITIVITY)  TROPONIN I (HIGH SENSITIVITY)    EKG EKG Interpretation  Date/Time:  Thursday August 19 2019 11:34:19 EST Ventricular Rate:  104 PR Interval:  168 QRS Duration: 82 QT Interval:  340 QTC Calculation: 447 R Axis:   82 Text Interpretation: Sinus tachycardia T wave abnormality, consider inferior ischemia Abnormal ECG No previous ECGs available Confirmed by Glynn Octave [phone]) on 08/19/2019 12:07:58 PM   Radiology Dg Chest Port 1 View  Result Date: 08/19/2019 CLINICAL DATA:  Chest pain.  COVID-19 positive EXAM: PORTABLE CHEST 1 VIEW COMPARISON:  None. FINDINGS: Lungs are clear. Heart size and pulmonary vascularity are normal. No adenopathy. No bone lesions. IMPRESSION: No  edema or consolidation.  No evident adenopathy. Electronically Signed   By: Bretta Bang III M.D.   On: 08/19/2019 12:30    Procedures Procedures (including critical care time)  Medications Ordered in ED Medications  sodium chloride flush (NS) 0.9 % injection 3 mL (3 mLs Intravenous Not Given 08/19/19 1159)     Initial Impression / Assessment and Plan / ED Course  I have reviewed the triage vital signs and the nursing notes.  Pertinent labs & imaging results that were available during my care of the patient were reviewed by me and considered in my medical decision making (  see chart for details).       Patient with known coronavirus positive test presenting with right-sided chest pain that he has been having for the past 2 days.  The pain is not necessarily exertional but is pleuritic and last several hours at a time.  He denies any significant shortness of breath, cough or fever.  His EKG has T wave inversions with no comparison.  Chest x-ray is negative for pneumonia or pneumothorax or rib fracture.  Suspect likely musculoskeletal chest pain that is not reproducible.  Patient mildly tachypneic and tachycardic.  D-dimer is negative however given his coronavirus status will proceed with CT PE.  Low suspicion for ACS.  Ongoing chest pain for several days with negative troponins.  Feel patient can be discharged if his CT scan is negative.  He knows to maintain quarantine precautions at home.  Dr. Tomi Bamberger to assume care at shift change.  ELSIE SAKUMA was evaluated in Emergency Department on 08/19/2019 for the symptoms described in the history of present illness. He was evaluated in the context of the global COVID-19 pandemic, which necessitated consideration that the patient might be at risk for infection with the SARS-CoV-2 virus that causes COVID-19. Institutional protocols and algorithms that pertain to the evaluation of patients at risk for COVID-19 are in a state of rapid change  based on information released by regulatory bodies including the CDC and federal and state organizations. These policies and algorithms were followed during the patient's care in the ED.    Final Clinical Impressions(s) / ED Diagnoses   Final diagnoses:  Atypical chest pain  COVID-19 virus detected    ED Discharge Orders    None       Dorena Dorfman, Annie Main, MD 08/19/19 1538

## 2019-08-19 NOTE — ED Triage Notes (Signed)
Pt states he was just told he had a positive COVID 19 test. Pt states he has been having cp for the past 2 days. Also complains of fatigue.

## 2019-09-21 ENCOUNTER — Other Ambulatory Visit: Payer: Self-pay

## 2019-09-21 DIAGNOSIS — Z20822 Contact with and (suspected) exposure to covid-19: Secondary | ICD-10-CM

## 2019-09-23 LAB — NOVEL CORONAVIRUS, NAA: SARS-CoV-2, NAA: NOT DETECTED

## 2021-03-14 IMAGING — CT CT ANGIO CHEST
2 of 6 series · 18 of 46 positions shown · IV contrast (omnipaque)
Comparison: Chest x-ray from same day.

CLINICAL DATA: Right-sided chest pain for the past 2 days. [id number]
positive.

EXAM:
CT ANGIOGRAPHY CHEST WITH CONTRAST
TECHNIQUE: Multidetector CT imaging of the chest was performed using the
standard protocol during bolus administration of intravenous
contrast. Multiplanar CT image reconstructions and MIPs were
obtained to evaluate the vascular anatomy.
CONTRAST:  100mL OMNIPAQUE IOHEXOL 350 MG/ML SOLN

[Series 7: thins · axial · 0.79mm/px · z∈[-416,-167]mm · 15 of 273 slices shown]
[im 12/273  lung]
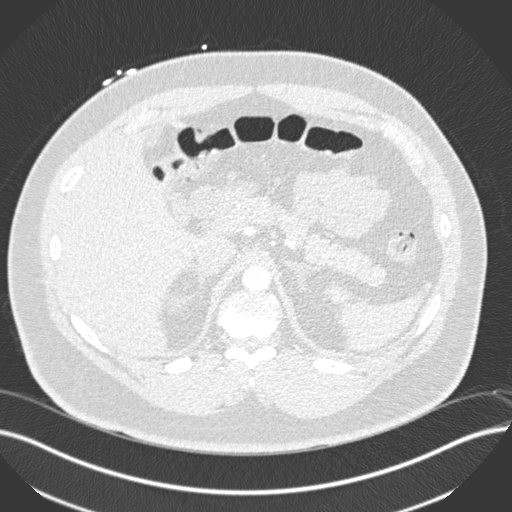
[im 36/273  soft-tissue]
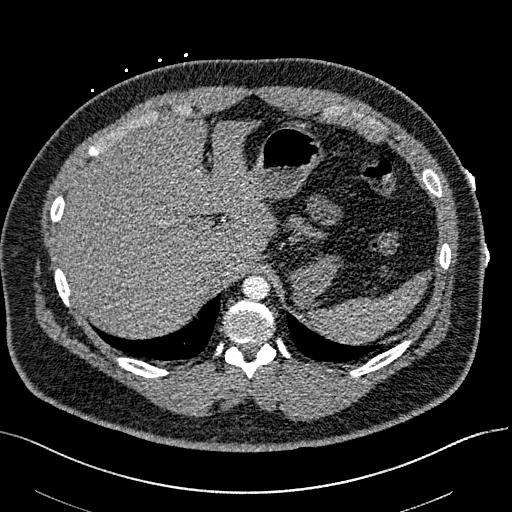
[im 48/273  lung]
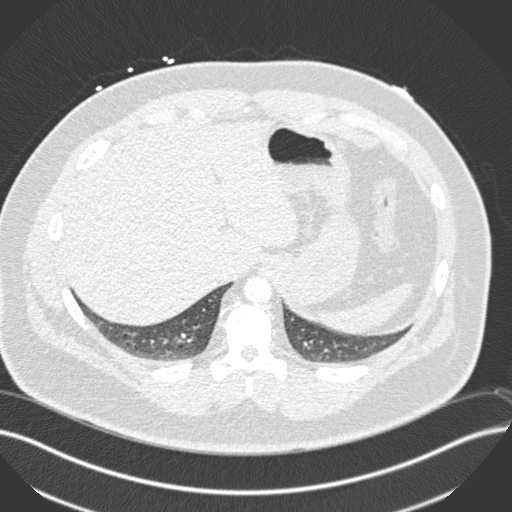
[im 71/273  soft-tissue]
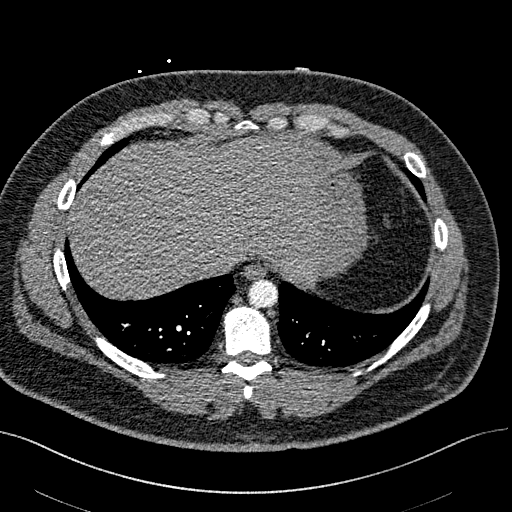
[im 83/273  lung]
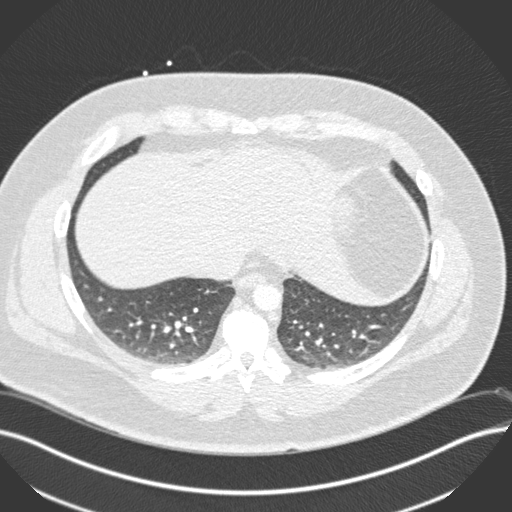
[im 107/273  soft-tissue]
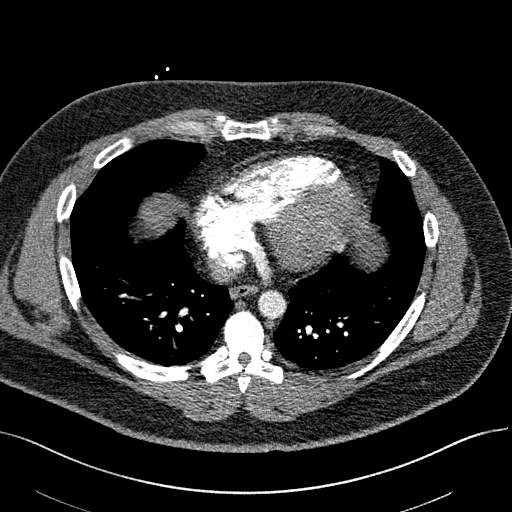
[im 119/273  lung]
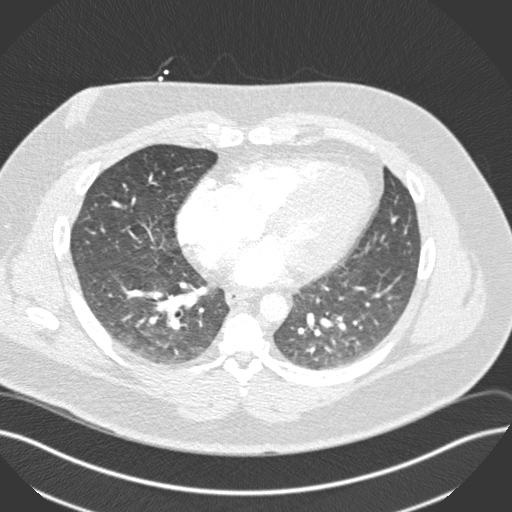
[im 142/273  soft-tissue]
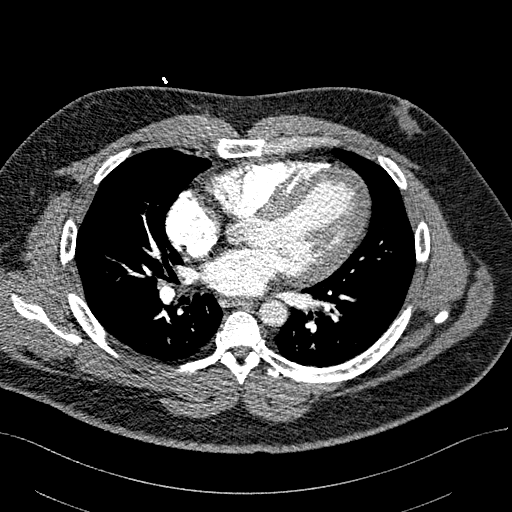
[im 154/273  lung]
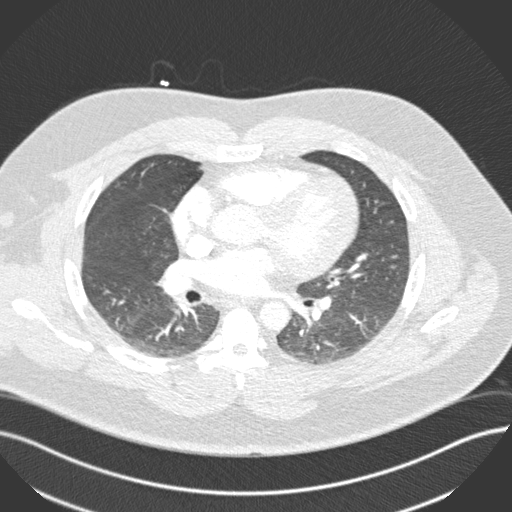
[im 166/273  soft-tissue]
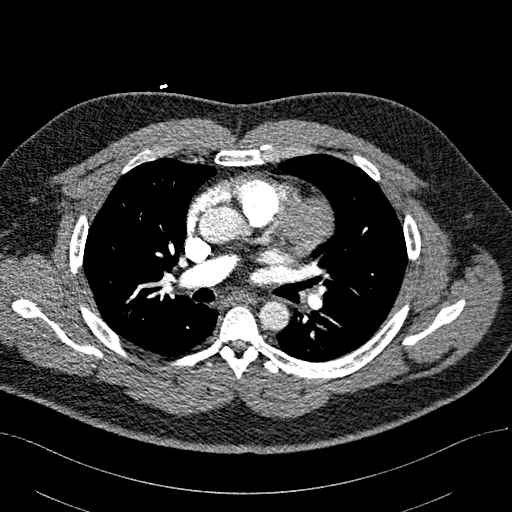
[im 190/273  lung]
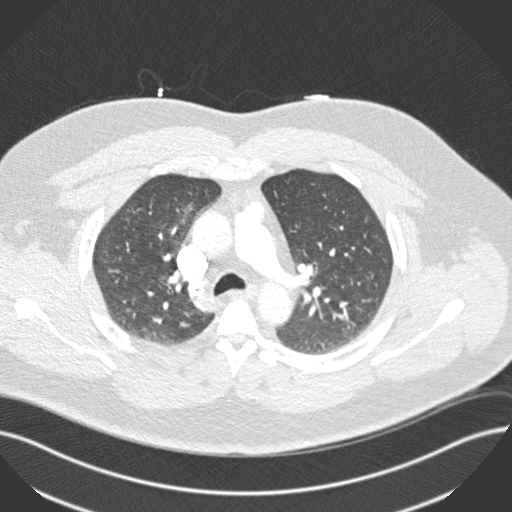
[im 202/273  soft-tissue]
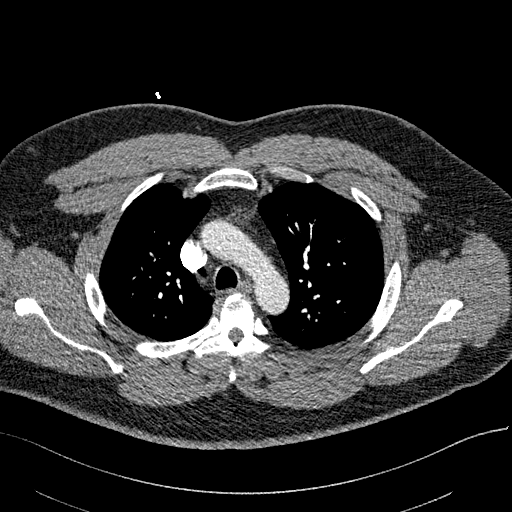
[im 225/273  lung]
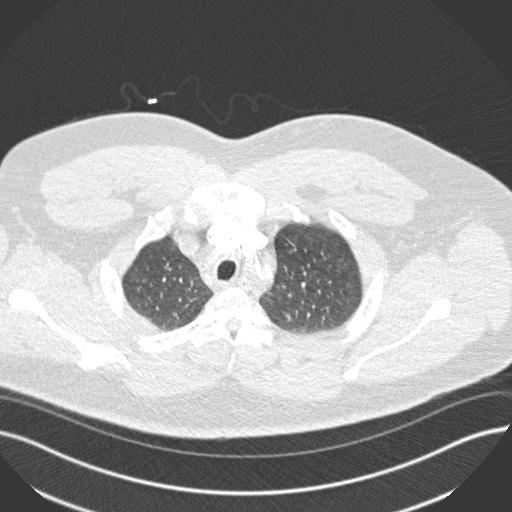
[im 237/273  soft-tissue]
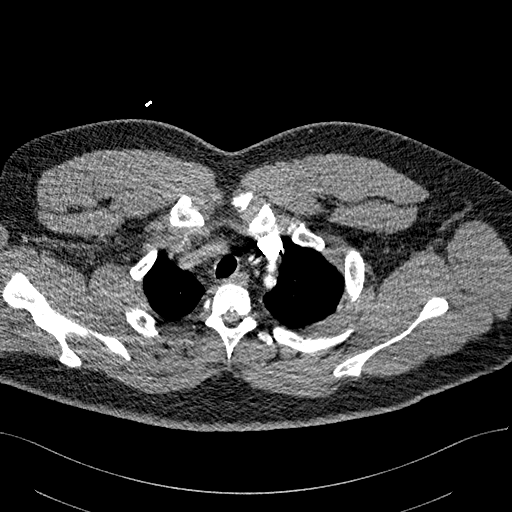
[im 261/273  lung]
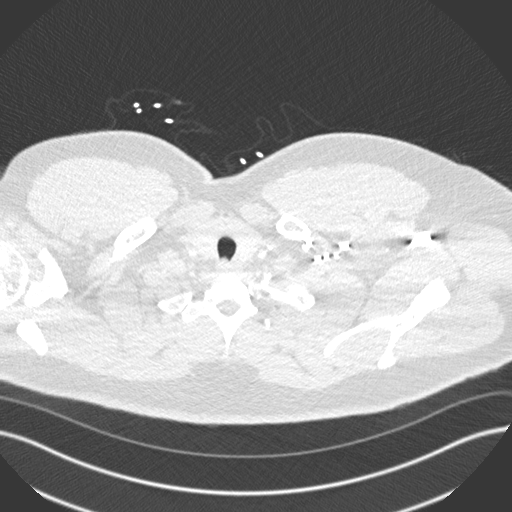

[Series 9: coronal mpr · coronal · 0.55mm/px · 3 of 173 slices shown]
[im 44/173  soft-tissue]
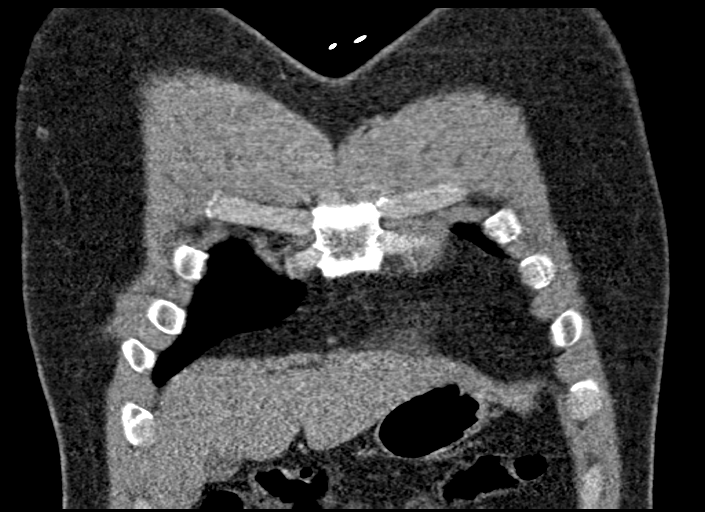
[im 87/173  soft-tissue]
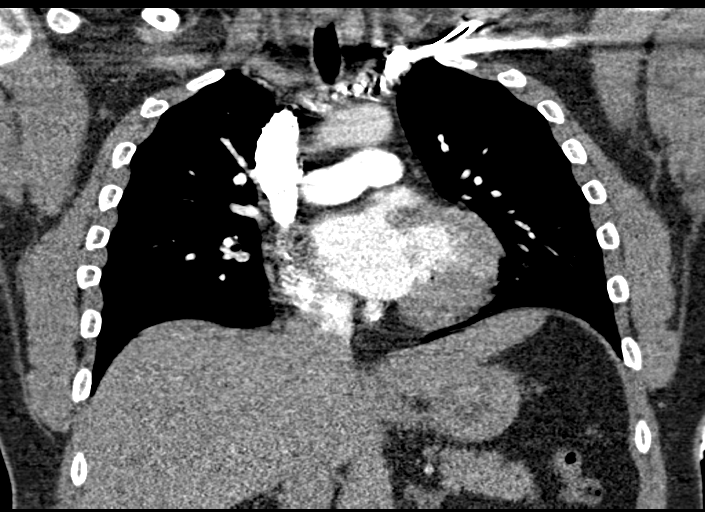
[im 130/173  soft-tissue]
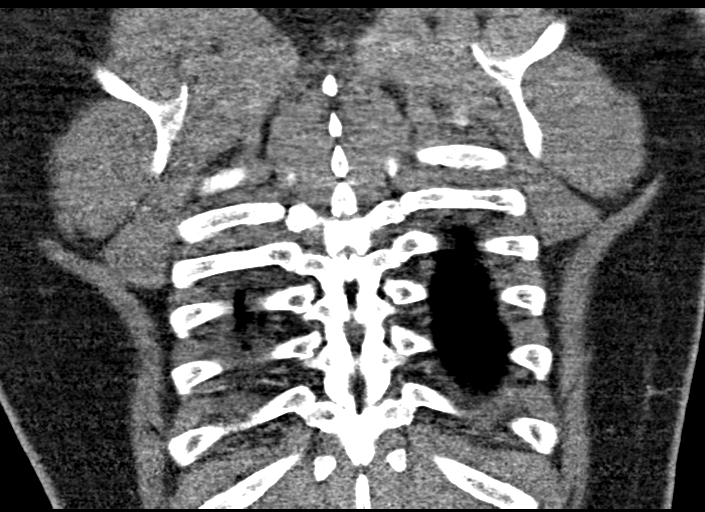

[18 of 46 positions shown; findings below may reference images not displayed]

FINDINGS: Cardiovascular: Satisfactory opacification of the pulmonary arteries
to the segmental level. No evidence of pulmonary embolism. Normal
heart size. No pericardial effusion. No thoracic aortic aneurysm.

Mediastinum/Nodes: No enlarged mediastinal, hilar, or axillary lymph
nodes. Thyroid gland, trachea, and esophagus demonstrate no
significant findings.

Lungs/Pleura: Lungs are clear. No pleural effusion or pneumothorax.

Upper Abdomen: No acute abnormality.

Musculoskeletal: Bilateral gynecomastia. No acute or significant
osseous findings.

Review of the MIP images confirms the above findings.
IMPRESSION: 1. No evidence of pulmonary embolism. No acute intrathoracic
process.

## 2021-03-28 ENCOUNTER — Emergency Department (HOSPITAL_COMMUNITY)
Admission: EM | Admit: 2021-03-28 | Discharge: 2021-03-28 | Disposition: A | Discharge status: Home / Self Care | Payer: Self-pay | Attending: Student | Admitting: Student

## 2021-03-28 ENCOUNTER — Other Ambulatory Visit: Payer: Self-pay

## 2021-03-28 ENCOUNTER — Encounter (HOSPITAL_COMMUNITY): Payer: Self-pay | Admitting: Pharmacy Technician

## 2021-03-28 DIAGNOSIS — R42 Dizziness and giddiness: Secondary | ICD-10-CM | POA: Insufficient documentation

## 2021-03-28 DIAGNOSIS — R519 Headache, unspecified: Secondary | ICD-10-CM | POA: Insufficient documentation

## 2021-03-28 DIAGNOSIS — R61 Generalized hyperhidrosis: Secondary | ICD-10-CM | POA: Insufficient documentation

## 2021-03-28 DIAGNOSIS — Z5321 Procedure and treatment not carried out due to patient leaving prior to being seen by health care provider: Secondary | ICD-10-CM | POA: Insufficient documentation

## 2021-03-28 DIAGNOSIS — R110 Nausea: Secondary | ICD-10-CM | POA: Insufficient documentation

## 2021-03-28 LAB — CBC WITH DIFFERENTIAL/PLATELET
Abs Immature Granulocytes: 0.03 10*3/uL (ref 0.00–0.07)
Basophils Absolute: 0.1 10*3/uL (ref 0.0–0.1)
Basophils Relative: 0 %
Eosinophils Absolute: 0.1 10*3/uL (ref 0.0–0.5)
Eosinophils Relative: 1 %
HCT: 49.7 % (ref 39.0–52.0)
Hemoglobin: 16.5 g/dL (ref 13.0–17.0)
Immature Granulocytes: 0 %
Lymphocytes Relative: 27 %
Lymphs Abs: 3.5 10*3/uL (ref 0.7–4.0)
MCH: 29.5 pg (ref 26.0–34.0)
MCHC: 33.2 g/dL (ref 30.0–36.0)
MCV: 88.9 fL (ref 80.0–100.0)
Monocytes Absolute: 0.9 10*3/uL (ref 0.1–1.0)
Monocytes Relative: 7 %
Neutro Abs: 8.4 10*3/uL — ABNORMAL HIGH (ref 1.7–7.7)
Neutrophils Relative %: 65 %
Platelets: 270 10*3/uL (ref 150–400)
RBC: 5.59 MIL/uL (ref 4.22–5.81)
RDW: 13.1 % (ref 11.5–15.5)
WBC: 13 10*3/uL — ABNORMAL HIGH (ref 4.0–10.5)
nRBC: 0 % (ref 0.0–0.2)

## 2021-03-28 LAB — BASIC METABOLIC PANEL
Anion gap: 10 (ref 5–15)
BUN: 11 mg/dL (ref 6–20)
CO2: 23 mmol/L (ref 22–32)
Calcium: 9.7 mg/dL (ref 8.9–10.3)
Chloride: 104 mmol/L (ref 98–111)
Creatinine, Ser: 1.21 mg/dL (ref 0.61–1.24)
GFR, Estimated: >60 mL/min (ref >60)
Glucose, Bld: 111 mg/dL — ABNORMAL HIGH (ref 70–99)
Potassium: 4 mmol/L (ref 3.5–5.1)
Sodium: 137 mmol/L (ref 135–145)

## 2021-03-28 LAB — CK: Total CK: 303 U/L (ref 49–397)

## 2021-03-28 NOTE — ED Triage Notes (Signed)
Pt here with reports of dizziness, fatigue after being in the heat. Pt reports he thinks he has heat exhaustion. Diaphoretic in triage.

## 2021-03-28 NOTE — ED Provider Notes (Signed)
Emergency Medicine Provider Triage Evaluation Note  Cody Best , a 40 y.o. male  was evaluated in triage.  Pt complains of concerns for heat exhaustion. Patient states he was working outside around 12:30PM when he became acutely diaphoretic, dizzy, and nauseous. He also endorses a headache. No emesis. No syncopal episodes. Patient denies chest pain and shortness of breath.  Review of Systems  Positive: Headache, nausea Negative: Chest pain  Physical Exam  BP 132/90   Pulse 64   Temp 97.6 F (36.4 C) (Oral)   Resp 17   SpO2 100%  Gen:   Awake, no distress   Resp:  Normal effort  MSK:   Moves extremities without difficulty  Other:    Medical Decision Making  Medically screening exam initiated at 3:29 PM.  Appropriate orders placed.  Cody Best was informed that the remainder of the evaluation will be completed by another provider, this initial triage assessment does not replace that evaluation, and the importance of remaining in the ED until their evaluation is complete.  Labs to rule out signs of dehydration and electrolyte abnormalities.    Mannie Stabile, PA-C 03/28/21 1531    Arby Barrette, MD 04/02/21 [phone]

## 2021-03-28 NOTE — ED Notes (Signed)
Pt left AMA °

## 2022-03-28 ENCOUNTER — Ambulatory Visit
Admission: EM | Admit: 2022-03-28 | Discharge: 2022-03-28 | Disposition: A | Discharge status: Home / Self Care | Payer: Self-pay | Attending: Family Medicine | Admitting: Family Medicine

## 2022-03-28 DIAGNOSIS — L02212 Cutaneous abscess of back [any part, except buttock]: Secondary | ICD-10-CM

## 2022-03-28 MED ORDER — IBUPROFEN 800 MG PO TABS: 800.0000 mg | ORAL_TABLET | Freq: Three times a day (TID) | ORAL | 0 refills | Status: AC | PRN

## 2022-03-28 MED ORDER — AMOXICILLIN-POT CLAVULANATE 875-125 MG PO TABS: 1.0000 | ORAL_TABLET | Freq: Two times a day (BID) | ORAL | 0 refills | Status: AC

## 2022-03-28 NOTE — ED Provider Notes (Addendum)
[facility]    CSN: [id number] Arrival date & time: 03/28/22  1810      History   Chief Complaint Chief Complaint  Patient presents with   Cyst    HPI Cody Best is a 41 y.o. male.   HPI Here for pain and swelling in his right mid back.  This began bothering him a few days ago.  He has had some subjective fever.  He has had this drained a few times and has had the sebaceous cyst there removed at least once.  No past medical history on file.  Patient Active Problem List   Diagnosis Date Noted   FOLLICULITIS 01/13/2008    Past Surgical History:  Procedure Laterality Date   CYST REMOVAL TRUNK Left 11/12/2013   Procedure: MINOR CYST REMOVAL TRUNK;  Surgeon: Valarie Merino, MD;  Location: [facility];  Service: General;  Laterality: Left;       Home Medications    Prior to Admission medications   Medication Sig Start Date End Date Taking? Authorizing Provider  amoxicillin-clavulanate (AUGMENTIN) 875-125 MG tablet Take 1 tablet by mouth 2 (two) times daily for 7 days. 03/28/22 04/04/22 Yes Daisie Haft, Janace Aris, MD  ibuprofen (ADVIL) 800 MG tablet Take 1 tablet (800 mg total) by mouth every 8 (eight) hours as needed (pain). 03/28/22  Yes Zenia Resides, MD    Family History Family History  Problem Relation Age of Onset   Diabetes Mother    Hyperlipidemia Mother    Heart disease Mother    Diabetes Maternal Grandmother    Hyperlipidemia Maternal Grandmother    Heart disease Maternal Grandmother     Social History Social History   Tobacco Use   Smoking status: Every Day    Packs/day: 0.50    Types: Cigarettes   Smokeless tobacco: Never  Substance Use Topics   Alcohol use: Yes    Alcohol/week: 1.0 standard drink of alcohol    Types: 1 Standard drinks or equivalent per week   Drug use: No     Allergies   Patient has no known allergies.   Review of Systems Review of Systems   Physical Exam Triage Vital Signs ED  Triage Vitals  Enc Vitals Group     BP 03/28/22 1822 125/75     Pulse Rate 03/28/22 1822 96     Resp 03/28/22 1822 18     Temp 03/28/22 1822 97.8 F (36.6 C)     Temp Source 03/28/22 1822 Oral     SpO2 03/28/22 1822 96 %     Weight --      Height --      Head Circumference --      Peak Flow --      Pain Score 03/28/22 1821 0     Pain Loc --      Pain Edu? --      Excl. in GC? --    No data found.  Updated Vital Signs BP 125/75 (BP Location: Right Arm)   Pulse 96   Temp 97.8 F (36.6 C) (Oral)   Resp 18   SpO2 96%   Visual Acuity Right Eye Distance:   Left Eye Distance:   Bilateral Distance:    Right Eye Near:   Left Eye Near:    Bilateral Near:     Physical Exam Vitals reviewed.  Constitutional:      General: He is not in acute distress.    Appearance: He is not  ill-appearing, toxic-appearing or diaphoretic.  Skin:    Comments: On his right upper lumbar region there is an area of erythema and induration approximately 6 cm x 5 cm.  There are 2 areas of fluctuance 1 on the superior and medial end of the lesion and 1 on the inferior lateral end of the lesion.  Neurological:     Mental Status: He is alert and oriented to person, place, and time.      UC Treatments / Results  Labs (all labs ordered are listed, but only abnormal results are displayed) Labs Reviewed - No data to display  EKG   Radiology No results found.  Procedures Procedures (including critical care time)  Medications Ordered in UC Medications - No data to display  Initial Impression / Assessment and Plan / UC Course  I have reviewed the triage vital signs and the nursing notes.  Pertinent labs & imaging results that were available during my care of the patient were reviewed by me and considered in my medical decision making (see chart for details).     After verbal consent is obtained, under clean conditions 1% lidocaine plain is used to place some local anesthesia in the upper  end in the inferior end of the fluctuance.  #11 blade is used to make an incision in the upper pole of the abscess first 7 mL of purulent material was expressed from the lesion in the lower pole fluctuant area receded.  Iodoform gauze 1/4 inch was placed to pack the cavity.  The cavity was probed and was at a depth of about 3 cm.  Blood loss 1 mL.  He will return in 2 days to have the packing removed, and he will take Augmentin and ibuprofen as needed Final Clinical Impressions(s) / UC Diagnoses   Final diagnoses:  Abscess of back     Discharge Instructions      Take amoxicillin-clavulanate 875 mg--1 tab twice daily with food for 7 days  Take ibuprofen 800 mg--1 tab every 8 hours as needed for pain.  Place a new clean bandage on the area 2 times daily at first. You may need to wet the bandage to loosen the packing from the bandage  Return here in 2 days for packing removal and wound recheck     ED Prescriptions     Medication Sig Dispense Auth. Provider   amoxicillin-clavulanate (AUGMENTIN) 875-125 MG tablet Take 1 tablet by mouth 2 (two) times daily for 7 days. 14 tablet Camar Guyton, Janace Aris, MD   ibuprofen (ADVIL) 800 MG tablet Take 1 tablet (800 mg total) by mouth every 8 (eight) hours as needed (pain). 21 tablet Darek Eifler, Janace Aris, MD      PDMP not reviewed this encounter.   Zenia Resides, MD 03/28/22 Vinnie Langton    Zenia Resides, MD 03/28/22 [phone]

## 2022-03-28 NOTE — ED Triage Notes (Signed)
Patient presents to Urgent Care with complaints of cyst on right side that is reoccurring pt has had surgery to remove sac. Pt reports it reoccurred about 7 days ago. Patient reports pain and swelling .

## 2022-03-28 NOTE — Discharge Instructions (Addendum)
Take amoxicillin-clavulanate 875 mg--1 tab twice daily with food for 7 days  Take ibuprofen 800 mg--1 tab every 8 hours as needed for pain.  Place a new clean bandage on the area 2 times daily at first. You may need to wet the bandage to loosen the packing from the bandage  Return here in 2 days for packing removal and wound recheck

## 2022-04-01 ENCOUNTER — Ambulatory Visit
Admission: EM | Admit: 2022-04-01 | Discharge: 2022-04-01 | Disposition: A | Discharge status: Home / Self Care | Payer: Self-pay | Attending: Internal Medicine | Admitting: Internal Medicine

## 2022-04-01 DIAGNOSIS — S21201D Unspecified open wound of right back wall of thorax without penetration into thoracic cavity, subsequent encounter: Secondary | ICD-10-CM

## 2022-04-01 DIAGNOSIS — L02212 Cutaneous abscess of back [any part, except buttock]: Secondary | ICD-10-CM

## 2022-04-01 NOTE — ED Provider Notes (Addendum)
[facility]    CSN: [id number] Arrival date & time: 04/01/22  1706      History   Chief Complaint Chief Complaint  Patient presents with   Wound Check    HPI Cody Best is a 41 y.o. male returns to urgent care for wound check.  Patient had incision and drainage done on Friday 6/16.  Wound packing done on Friday was removed the following day.  Patient has noticed decreasing pain as well as drainage from the incision and drainage site.  He continues to do daily wound dressing changes.  No fever or chills.  Patient is compliant with antibiotics.  Returns to urgent care for wound check.Marland Kitchen   HPI  History reviewed. No pertinent past medical history.  Patient Active Problem List   Diagnosis Date Noted   FOLLICULITIS 01/13/2008    Past Surgical History:  Procedure Laterality Date   CYST REMOVAL TRUNK Left 11/12/2013   Procedure: MINOR CYST REMOVAL TRUNK;  Surgeon: Valarie Merino, MD;  Location: [facility];  Service: General;  Laterality: Left;       Home Medications    Prior to Admission medications   Medication Sig Start Date End Date Taking? Authorizing Provider  amoxicillin-clavulanate (AUGMENTIN) 875-125 MG tablet Take 1 tablet by mouth 2 (two) times daily for 7 days. 03/28/22 04/04/22  Zenia Resides, MD  ibuprofen (ADVIL) 800 MG tablet Take 1 tablet (800 mg total) by mouth every 8 (eight) hours as needed (pain). 03/28/22   Zenia Resides, MD    Family History Family History  Problem Relation Age of Onset   Diabetes Mother    Hyperlipidemia Mother    Heart disease Mother    Diabetes Maternal Grandmother    Hyperlipidemia Maternal Grandmother    Heart disease Maternal Grandmother     Social History Social History   Tobacco Use   Smoking status: Every Day    Packs/day: 0.50    Types: Cigarettes   Smokeless tobacco: Never  Substance Use Topics   Alcohol use: Yes    Alcohol/week: 1.0 standard drink of alcohol    Types: 1  Standard drinks or equivalent per week   Drug use: No     Allergies   Patient has no known allergies.   Review of Systems Review of Systems As per HPI  Physical Exam Triage Vital Signs ED Triage Vitals  Enc Vitals Group     BP 04/01/22 1718 116/68     Pulse Rate 04/01/22 1718 90     Resp 04/01/22 1718 18     Temp 04/01/22 1718 98.4 F (36.9 C)     Temp Source 04/01/22 1718 Oral     SpO2 04/01/22 1718 97 %     Weight --      Height --      Head Circumference --      Peak Flow --      Pain Score 04/01/22 1719 0     Pain Loc --      Pain Edu? --      Excl. in GC? --    No data found.  Updated Vital Signs BP 116/68 (BP Location: Left Arm)   Pulse 90   Temp 98.4 F (36.9 C) (Oral)   Resp 18   SpO2 97%   Visual Acuity Right Eye Distance:   Left Eye Distance:   Bilateral Distance:    Right Eye Near:   Left Eye Near:    Bilateral  Near:     Physical Exam Vitals and nursing note reviewed.  Abdominal:     General: Bowel sounds are normal.     Palpations: Abdomen is soft.  Skin:    Comments: Induration over the right mid back region.  Mild purulent discharge.  No surrounding erythema.  Indurated area measures about 2 inches in the longest diameter.      UC Treatments / Results  Labs (all labs ordered are listed, but only abnormal results are displayed) Labs Reviewed - No data to display  EKG   Radiology No results found.  Procedures Procedures (including critical care time)  Medications Ordered in UC Medications - No data to display  Initial Impression / Assessment and Plan / UC Course  I have reviewed the triage vital signs and the nursing notes.  Pertinent labs & imaging results that were available during my care of the patient were reviewed by me and considered in my medical decision making (see chart for details).     1.  Wound on the right side of the back: Continue antibiotic use Warm compresses Tylenol/Motrin as needed for  pain Wound dressing changed in the urgent care Return to urgent care if symptoms worsen. Final Clinical Impressions(s) / UC Diagnoses   Final diagnoses:  Wound of right side of back, subsequent encounter     Discharge Instructions      Wash the area with mild soap and water Warm compress 2-3 times a day Continue taking antibiotics Continue daily wound dressing changes with Neosporin Return to urgent care if you have any other concerns.   ED Prescriptions   None    PDMP not reviewed this encounter.   Merrilee Jansky, MD 04/01/22 Angelene Giovanni    Merrilee Jansky, MD 04/01/22 [phone]

## 2022-04-01 NOTE — Discharge Instructions (Addendum)
Wash the area with mild soap and water Warm compress 2-3 times a day Continue taking antibiotics Continue daily wound dressing changes with Neosporin Return to urgent care if you have any other concerns.

## 2022-04-01 NOTE — ED Triage Notes (Signed)
Pt here for wound check 2/2 lancing abscess on back Friday.

## 2023-08-16 ENCOUNTER — Other Ambulatory Visit: Payer: Self-pay

## 2023-08-16 ENCOUNTER — Emergency Department (HOSPITAL_BASED_OUTPATIENT_CLINIC_OR_DEPARTMENT_OTHER)
Admission: EM | Admit: 2023-08-16 | Discharge: 2023-08-16 | Disposition: A | Discharge status: Home / Self Care | Payer: BC Managed Care – PPO | Attending: Emergency Medicine | Admitting: Emergency Medicine

## 2023-08-16 ENCOUNTER — Encounter (HOSPITAL_BASED_OUTPATIENT_CLINIC_OR_DEPARTMENT_OTHER): Payer: Self-pay | Admitting: Emergency Medicine

## 2023-08-16 DIAGNOSIS — S86012A Strain of left Achilles tendon, initial encounter: Secondary | ICD-10-CM | POA: Diagnosis not present

## 2023-08-16 DIAGNOSIS — S8992XA Unspecified injury of left lower leg, initial encounter: Secondary | ICD-10-CM | POA: Diagnosis present

## 2023-08-16 DIAGNOSIS — X501XXA Overexertion from prolonged static or awkward postures, initial encounter: Secondary | ICD-10-CM | POA: Diagnosis not present

## 2023-08-16 DIAGNOSIS — Y9301 Activity, walking, marching and hiking: Secondary | ICD-10-CM | POA: Diagnosis not present

## 2023-08-16 DIAGNOSIS — M7989 Other specified soft tissue disorders: Secondary | ICD-10-CM | POA: Insufficient documentation

## 2023-08-16 MED ORDER — ACETAMINOPHEN 500 MG PO TABS: 1000.0000 mg | ORAL_TABLET | Freq: Once | ORAL | Status: DC

## 2023-08-16 MED ORDER — HYDROCODONE-ACETAMINOPHEN 5-325 MG PO TABS: 1.0000 | ORAL_TABLET | Freq: Four times a day (QID) | ORAL | 0 refills | Status: AC | PRN

## 2023-08-16 NOTE — Discharge Instructions (Signed)
Please follow-up with the orthopedic specialist have attached your for you today.  Today your exam shows that you may have an Achilles tendon injury that will need to be followed up with the orthopedist and may need an MRI for further evaluation.  You may take Tylenol ibuprofen every 6 hours needed for pain however prescribe you a few days worth of Norco for pain not controlled by this.  We placed you in a splint and given you crutches to help ambulate.  I have also attached a work note for you.  If symptoms change or worsen please return to ER.

## 2023-08-16 NOTE — ED Provider Notes (Signed)
[place] EMERGENCY DEPARTMENT AT [facility] Provider Note   CSN: [id number] Arrival date & time: 08/16/23  1132     History  Chief Complaint  Patient presents with   Leg Injury    Cody Best is a 42 y.o. male with left Achilles pain that occurred at 11 AM today.  Patient states he was walking some stepped on his foot on the backside and he kept walking and felt a loud pop and he dropped.  Patient was initially able to bear weight however cannot now due to pain.  Patient states he can barely move his ankle up and down due to pain.  Patient is concerned that he injured his Achilles.  Patient denies change in sensation, skin color changes  Home Medications Prior to Admission medications   Medication Sig Start Date End Date Taking? Authorizing Provider  HYDROcodone-acetaminophen (NORCO) 5-325 MG tablet Take 1 tablet by mouth every 6 (six) hours as needed for up to 5 days for moderate pain (pain score 4-6). 08/16/23 08/21/23 Yes Emillio Ngo, Beverly Gust, PA-C  ibuprofen (ADVIL) 800 MG tablet Take 1 tablet (800 mg total) by mouth every 8 (eight) hours as needed (pain). 03/28/22   Zenia Resides, MD      Allergies    Patient has no known allergies.    Review of Systems   Review of Systems  Physical Exam Updated Vital Signs BP 113/79 (BP Location: Left Arm)   Pulse 99   Temp 98.1 F (36.7 C)   Resp 14   SpO2 99%  Physical Exam Vitals reviewed.  Constitutional:      General: He is not in acute distress. Cardiovascular:     Rate and Rhythm: Normal rate.     Pulses: Normal pulses.  Musculoskeletal:     Comments: Left ankle: Achilles does appear deformed when compared to the right side, indent noted when palpating left Achilles, unable to plantarflex or dorsiflex ankle, no bony abnormalities or tenderness noted, negative anterior drawer test, 5 out of 5 knee extension/flexion on the left side, positive Thompson test on the left side Patient unable to bear weight as he  feels unstable in his left ankle Pain not out of proportion Soft compartments  Skin:    General: Skin is warm and dry.     Capillary Refill: Capillary refill takes less than 2 seconds.  Neurological:     Mental Status: He is alert.     Comments: Sensation intact distally  Psychiatric:        Mood and Affect: Mood normal.     ED Results / Procedures / Treatments   Labs (all labs ordered are listed, but only abnormal results are displayed) Labs Reviewed - No data to display  EKG None  Radiology No results found.  Procedures Ultrasound ED Soft Tissue  Date/Time: 08/16/2023 1:50 PM  Performed by: Netta Corrigan, PA-C Authorized by: Netta Corrigan, PA-C   Procedure details:    Indications: limb pain     Transverse view:  Visualized   Longitudinal view:  Visualized   Images: archived   Location:    Location comment:  Achilles   Side:  Left Findings:     no abscess present    no cellulitis present    no foreign body present Comments:     Achilles tendon does have disconnect and appears torn     Medications Ordered in ED Medications  acetaminophen (TYLENOL) tablet 1,000 mg (has no administration in time range)  ED Course/ Medical Decision Making/ A&P                                 Medical Decision Making  Cody Best 42 y.o. presented today for left achilles pain. Working DDx that I considered at this time includes, but not limited to, Achilles tendon tear contusion, strain/sprain, fracture, dislocation, neurovascular compromise, septic joint, ischemic limb, compartment syndrome.  R/o DDx: contusion, strain/sprain, fracture, dislocation, neurovascular compromise, septic joint, ischemic limb, compartment syndrome: These are considered less likely due to history of present illness, physical exam, labs/imaging findings.  Review of prior external notes: 04/01/2022 ED  Unique Tests and My Interpretation:  Ultrasound MSK: Achilles tendon appears  torn  Social Determinants of Health: none  Discussion with Independent Historian: None  Discussion of Management of Tests: None  Risk: Medium: prescription drug management  Risk Stratification Score: None  Plan: On exam patient was in no acute distress vitals.  Patient was neurovascularly intact.  Patient on exam did have positive Thompson test on the left side and has obvious deformity of his Achilles on the left side when compared to the right.  When palpating the Achilles patient has become very uncomfortable and there does appear to be a drop off within the Achilles tendon.  Bedside ultrasound was done that does show disconnect within Achilles tendon suspicious of torn Achilles.  Will place patient in short leg posterior splint with his ankle slightly plantarflexed and give crutches with orthopedic follow-up as he will need an MRI to further evaluate.  Will give patient Norco for pain not controlled by Tylenol or ibuprofen at home.  Patient given work note as well.  I spoke to the patient about getting x-ray to rule out avulsion fracture however patient declined at this time as he states he just wants follow-up with orthopedics.  Patient does understand this may limit the evaluation lead to missed diagnoses and patient.  Any capacity verbalized understand acceptance of this and still wants to proceed.  Will splint ankle and have him follow-up with orthopedics.  Patient was given return precautions. Patient stable for discharge at this time.  Patient verbalized understanding of plan.  This chart was dictated using voice recognition software.  Despite best efforts to proofread,  errors can occur which can change the documentation meaning.         Final Clinical Impression(s) / ED Diagnoses Final diagnoses:  Rupture of left Achilles tendon, initial encounter    Rx / DC Orders ED Discharge Orders          Ordered    HYDROcodone-acetaminophen (NORCO) 5-325 MG tablet  Every 6 hours  PRN        08/16/23 1402              Netta Corrigan, PA-C 08/16/23 1404    Ernie Avena, MD 08/16/23 1500

## 2023-08-16 NOTE — ED Triage Notes (Signed)
Left Leg injury. Some one stepped on back of show and leg when another way.  Pain. Heard a pop Happened around 11AM

## 2023-08-20 ENCOUNTER — Encounter (HOSPITAL_COMMUNITY): Payer: Self-pay | Admitting: Anesthesiology

## 2023-08-20 ENCOUNTER — Other Ambulatory Visit: Payer: Self-pay

## 2023-08-20 ENCOUNTER — Other Ambulatory Visit (HOSPITAL_COMMUNITY): Payer: Self-pay | Admitting: Orthopedic Surgery

## 2023-08-20 ENCOUNTER — Encounter (HOSPITAL_BASED_OUTPATIENT_CLINIC_OR_DEPARTMENT_OTHER): Payer: Self-pay | Admitting: Orthopedic Surgery

## 2023-08-21 ENCOUNTER — Ambulatory Visit (HOSPITAL_BASED_OUTPATIENT_CLINIC_OR_DEPARTMENT_OTHER)
Admission: RE | Admit: 2023-08-21 | Payer: BC Managed Care – PPO | Source: Ambulatory Visit | Admitting: Orthopedic Surgery

## 2023-08-21 DIAGNOSIS — Z01818 Encounter for other preprocedural examination: Secondary | ICD-10-CM

## 2023-08-21 SURGERY — REPAIR, TENDON, ACHILLES
Anesthesia: General | Laterality: Left

## 2023-12-22 ENCOUNTER — Ambulatory Visit: Admission: EM | Admit: 2023-12-22 | Discharge: 2023-12-22 | Disposition: A | Discharge status: Home / Self Care

## 2023-12-22 DIAGNOSIS — L723 Sebaceous cyst: Secondary | ICD-10-CM

## 2023-12-22 DIAGNOSIS — L089 Local infection of the skin and subcutaneous tissue, unspecified: Secondary | ICD-10-CM | POA: Diagnosis not present

## 2023-12-22 MED ORDER — DOXYCYCLINE HYCLATE 100 MG PO CAPS: 100.0000 mg | ORAL_CAPSULE | Freq: Two times a day (BID) | ORAL | 0 refills | Status: AC

## 2023-12-22 NOTE — ED Provider Notes (Signed)
 [facility]    CSN: [id number] Arrival date & time: 12/22/23  1737      History   Chief Complaint Chief Complaint  Patient presents with   Skin Problem    HPI Cody Best is a 43 y.o. male.   Patient here today for evaluation of suspected infected cyst to his right lateral mid back. He reports he has had this occur multiple things along the way. He has had cyst in same area removed in the past surgically but came back years later. He has not had fever.  The history is provided by the patient.    History reviewed. No pertinent past medical history.  Patient Active Problem List   Diagnosis Date Noted   FOLLICULITIS 01/13/2008    Past Surgical History:  Procedure Laterality Date   CYST REMOVAL TRUNK Left 11/12/2013   Procedure: MINOR CYST REMOVAL TRUNK;  Surgeon: Valarie Merino, MD;  Location: [facility];  Service: General;  Laterality: Left;       Home Medications    Prior to Admission medications   Medication Sig Start Date End Date Taking? Authorizing Provider  ASPIRIN LOW DOSE 81 MG tablet Take 81 mg by mouth 2 (two) times daily. 08/26/23  Yes [provider]  doxycycline (VIBRAMYCIN) 100 MG capsule Take 1 capsule (100 mg total) by mouth 2 (two) times daily for 7 days. 12/22/23 12/29/23 Yes Tomi Bamberger, PA-C  oxyCODONE (OXY IR/ROXICODONE) 5 MG immediate release tablet Take 5 mg by mouth every 6 (six) hours as needed. 08/26/23  Yes [provider]  ibuprofen (ADVIL) 800 MG tablet Take 1 tablet (800 mg total) by mouth every 8 (eight) hours as needed (pain). 03/28/22   Zenia Resides, MD  rosuvastatin (CRESTOR) 10 MG tablet Take 10 mg by mouth daily.    [provider]    Family History Family History  Problem Relation Age of Onset   Diabetes Mother    Hyperlipidemia Mother    Heart disease Mother    Diabetes Maternal Grandmother    Hyperlipidemia Maternal Grandmother    Heart disease Maternal  Grandmother     Social History Social History   Tobacco Use   Smoking status: Former    Current packs/day: 0.50    Types: Cigarettes   Smokeless tobacco: Never  Vaping Use   Vaping status: Every Day   Substances: Nicotine, Flavoring  Substance Use Topics   Alcohol use: Yes    Alcohol/week: 1.0 standard drink of alcohol    Types: 1 Standard drinks or equivalent per week    Comment: Occassionally.   Drug use: No     Allergies   Patient has no known allergies.   Review of Systems Review of Systems  Constitutional:  Negative for chills and fever.  Eyes:  Negative for discharge and redness.  Respiratory:  Negative for shortness of breath.   Skin:  Positive for color change. Negative for wound.  Neurological:  Negative for numbness.     Physical Exam Triage Vital Signs ED Triage Vitals  Encounter Vitals Group     BP 12/22/23 1826 121/75     Systolic BP Percentile --      Diastolic BP Percentile --      Pulse Rate 12/22/23 1826 81     Resp 12/22/23 1826 18     Temp 12/22/23 1826 98.2 F (36.8 C)     Temp Source 12/22/23 1826 Oral     SpO2 12/22/23  1826 97 %     Weight 12/22/23 1825 220 lb (99.8 kg)     Height 12/22/23 1825 5\' 10"  (1.778 m)     Head Circumference --      Peak Flow --      Pain Score 12/22/23 1823 6     Pain Loc --      Pain Education --      Exclude from Growth Chart --    No data found.  Updated Vital Signs BP 121/75 (BP Location: Right Arm)   Pulse 81   Temp 98.2 F (36.8 C) (Oral)   Resp 18   Ht 5\' 10"  (1.778 m)   Wt 220 lb (99.8 kg)   SpO2 97%   BMI 31.57 kg/m   Visual Acuity Right Eye Distance:   Left Eye Distance:   Bilateral Distance:    Right Eye Near:   Left Eye Near:    Bilateral Near:     Physical Exam Vitals and nursing note reviewed.  Constitutional:      General: He is not in acute distress.    Appearance: Normal appearance. He is not ill-appearing.  HENT:     Head: Normocephalic and atraumatic.  Eyes:      Conjunctiva/sclera: Conjunctivae normal.  Cardiovascular:     Rate and Rhythm: Normal rate.  Pulmonary:     Effort: Pulmonary effort is normal.  Skin:         Comments: Approx 4 cm area of induration and fluctuance to right lateral mid back without active drainage.  Neurological:     Mental Status: He is alert.  Psychiatric:        Mood and Affect: Mood normal.        Behavior: Behavior normal.        Thought Content: Thought content normal.      UC Treatments / Results  Labs (all labs ordered are listed, but only abnormal results are displayed) Labs Reviewed - No data to display  EKG   Radiology No results found.  Procedures Incision and Drainage  Date/Time: 12/22/2023 7:07 PM  Performed by: Tomi Bamberger, PA-C Authorized by: Tomi Bamberger, PA-C   Consent:    Consent obtained:  Verbal   Consent given by:  Patient   Risks, benefits, and alternatives were discussed: yes     Risks discussed:  Bleeding, incomplete drainage and pain   Alternatives discussed:  Alternative treatment Universal protocol:    Procedure explained and questions answered to patient or proxy's satisfaction: yes     Patient identity confirmed:  Verbally with patient Location:    Type:  Abscess   Location:  Trunk   Trunk location:  Back Pre-procedure details:    Skin preparation:  Povidone-iodine Sedation:    Sedation type:  None Anesthesia:    Anesthesia method:  Local infiltration   Local anesthetic:  Lidocaine 1% WITH epi (1.5cc) Procedure type:    Complexity:  Simple Procedure details:    Incision types:  Single straight   Incision depth:  Subcutaneous   Drainage:  Purulent   Drainage amount:  Copious   Wound treatment:  Wound left open   Packing materials:  None Post-procedure details:    Procedure completion:  Tolerated well, no immediate complications  (including critical care time)  Medications Ordered in UC Medications - No data to display  Initial Impression /  Assessment and Plan / UC Course  I have reviewed the triage vital signs and the nursing notes.  Pertinent labs & imaging results that were available during my care of the patient were reviewed by me and considered in my medical decision making (see chart for details).    I & D performed in office. Advised ultimately he would need to see surgery for removal. Patient expressed understanding. Will treat with doxycycline and advised warm compresses to promote continued drainage. Recommend follow up with any concerns.   Final Clinical Impressions(s) / UC Diagnoses   Final diagnoses:  Infected sebaceous cyst     Discharge Instructions       Please follow up with surgery for definitive removal of cyst.       ED Prescriptions     Medication Sig Dispense Auth. Provider   doxycycline (VIBRAMYCIN) 100 MG capsule Take 1 capsule (100 mg total) by mouth 2 (two) times daily for 7 days. 14 capsule Tomi Bamberger, PA-C      PDMP not reviewed this encounter.   Tomi Bamberger, PA-C 12/22/23 1909

## 2023-12-22 NOTE — ED Triage Notes (Signed)
"  I have a cyst on the right upper middle side of my back (I had the surgery some time ago but keeps recurrent)". No fever.

## 2023-12-22 NOTE — Discharge Instructions (Signed)
  Please follow up with surgery for definitive removal of cyst.

## 2024-05-06 ENCOUNTER — Inpatient Hospital Stay: Attending: Genetic Counselor | Admitting: Genetic Counselor

## 2024-05-06 ENCOUNTER — Inpatient Hospital Stay

## 2024-07-01 ENCOUNTER — Inpatient Hospital Stay: Attending: Genetic Counselor | Admitting: Licensed Clinical Social Worker

## 2024-07-01 ENCOUNTER — Encounter: Payer: Self-pay | Admitting: Licensed Clinical Social Worker

## 2024-07-01 DIAGNOSIS — Z801 Family history of malignant neoplasm of trachea, bronchus and lung: Secondary | ICD-10-CM

## 2024-07-01 DIAGNOSIS — Z800 Family history of malignant neoplasm of digestive organs: Secondary | ICD-10-CM

## 2024-07-01 DIAGNOSIS — Z8042 Family history of malignant neoplasm of prostate: Secondary | ICD-10-CM

## 2024-07-01 DIAGNOSIS — Z803 Family history of malignant neoplasm of breast: Secondary | ICD-10-CM

## 2024-07-01 NOTE — Progress Notes (Signed)
 REFERRING PROVIDER: Charlott Dorn LABOR, MD [address] [place],  [place] [address]  PRIMARY PROVIDER:  Charlott Dorn LABOR, MD  PRIMARY REASON FOR VISIT:  1. Family history of breast cancer   2. Family history of pancreatic cancer   3. Family history of prostate cancer   4. Family history of lung cancer    I connected with Cody Best on 07/01/2024 at 11:00 AM EDT by telephone and verified that I am speaking with the correct person using three identifiers.    Patient location: home Provider location: [facility]  HISTORY OF PRESENT ILLNESS:   Cody Best, a 43 y.o. male, was seen for a [facility] cancer genetics consultation at the request of Dr. Charlott due to a family history of cancer.  Cody Best presents to clinic today to discuss the possibility of a hereditary predisposition to cancer, genetic testing, and to further clarify his future cancer risks, as well as potential cancer risks for family members.   CANCER HISTORY:  Cody Best is a 43 y.o. male with no personal history of cancer.   Past Surgical History:  Procedure Laterality Date   CYST REMOVAL TRUNK Left 11/12/2013   Procedure: MINOR CYST REMOVAL TRUNK;  Surgeon: Donnice KATHEE Lunger, MD;  Location: [facility];  Service: General;  Laterality: Left;    FAMILY HISTORY:  We obtained a detailed, 4-generation family history.  Significant diagnoses are listed below: Family History  Problem Relation Age of Onset   Diabetes Mother    Hyperlipidemia Mother    Heart disease Mother    Breast cancer Maternal Aunt        25s   Diabetes Maternal Grandmother    Hyperlipidemia Maternal Grandmother    Heart disease Maternal Grandmother    Lung cancer Maternal Grandfather    Cody Best has 1 daughter, 77. He has no information about paternal side of the family.  Cody Best maternal aunt had breast cancer in her 53s. Maternal grandfather had lung cancer. Maternal grandmother's brother had  prostate cancer. Maternal grandmother's sister's granddaughter (patient's second cousin) had breast cancer in her 39s, and her son died of pancreatic cancer recently at 57.   Cody Best is unaware of previous family history of genetic testing for hereditary cancer risks. There is no reported Ashkenazi Jewish ancestry. There is no known consanguinity.    GENETIC COUNSELING ASSESSMENT: Cody Best is a 43 y.o. male with a family history which is somewhat suggestive of a hereditary cancer syndrome and predisposition to cancer. We, therefore, discussed and recommended the following at today's visit.   DISCUSSION: We discussed that, in general, most cancer is not inherited in families, but instead is sporadic or familial. Sporadic cancers occur by chance and typically happen at older ages (>50 years) as this type of cancer is caused by genetic changes acquired during an individual's lifetime. Some families have more cancers than would be expected by chance; however, the ages or types of cancer are not consistent with a known genetic mutation or known genetic mutations have been ruled out. This type of familial cancer is thought to be due to a combination of multiple genetic, environmental, hormonal, and lifestyle factors. While this combination of factors likely increases the risk of cancer, the exact source of this risk is not currently identifiable or testable.    We discussed that approximately 10% of cancer is hereditary. Most cases of hereditary breast/pancreatic cancer are associated with BRCA1/BRCA2 genes, although there are other  genes associated with hereditary cancer as well. Cancers and risks are gene specific. We discussed that testing is beneficial for several reasons including knowing about cancer risks, identifying potential screening and risk-reduction options that may be appropriate, and to understand if other family members could be at risk for cancer and allow them to undergo genetic testing.    We reviewed the characteristics, features and inheritance patterns of hereditary cancer syndromes. We also discussed genetic testing, including the appropriate family members to test, the process of testing, insurance coverage and turn-around-time for results. We discussed the implications of a negative, positive and/or variant of uncertain significant result. We recommended Cody Best pursue genetic testing for the [facility] CancerNext-Expanded gene panel.   The CancerNext-Expanded gene panel offered by [facility] and includes sequencing, rearrangement for the following 77 genes: AIP, ALK, APC, ATM, AXIN2, BAP1, BARD1, BMPR1A, BRCA1, BRCA2, BRIP1, CDC73, CDH1, CDK4, CDKN1B, CDKN2A, CEBPA, CHEK2, CTNNA1, DDX41, DICER1, ETV6, FH, FLCN, GATA2, LZTR1, MAX, MBD4, MEN1, MET, MLH1, MSH2, MSH3, MSH6, MUTYH, NF1, NF2, NTHL1, PALB2, PHOX2B, PMS2, POT1, PRKAR1A, PTCH1, PTEN, RAD51C, RAD51D, RB1, RET, RPS20, RUNX1, SDHA, SDHAF2, SDHB, SDHC, SDHD, SMAD4, SMARCA4, SMARCB1, SMARCE1, STK11, SUFU, TMEM127, TP53, TSC1, TSC2, VHL, and WT1 (sequencing and deletion/duplication); EGFR, HOXB13, KIT, MITF, PDGFRA, POLD1, and POLE (sequencing only); EPCAM and GREM1 (deletion/duplication only).   Based on Cody Best's family history of cancer, he does not quite meet medical criteria for testing since the cancers are in distant relatives. Though Cody Best is not personally affected, there are no affected family members that are willing/able to undergo hereditary cancer testing.  Therefore, Cody Best the most informative family member available. He may have an out of pocket cost.   PLAN: After considering the risks, benefits, and limitations, Cody Best provided informed consent to pursue genetic testing. A saliva kit was mailed to him and sample will be sent to [facility] for analysis of the CancerNext-Expanded Panel. Results should be available within approximately 2-3 weeks' time, at which point they will be disclosed by  telephone to Cody Best, as will any additional recommendations warranted by these results. Cody Best will receive a summary of his genetic counseling visit and a copy of his results once available. This information will also be available in Epic.   Cody Best questions were answered to his satisfaction today. Our contact information was provided should additional questions or concerns arise. Thank you for the referral and allowing us  to share in the care of your patient.   Dena Cary, MS, [facility] Genetic Counselor [email or web address] Phone: [phone]  45 minutes were spent on the date of the encounter in service to the patient including preparation, virtual consultation, documentation and care coordination. Dr. Delinda was available for discussion regarding this case.   _______________________________________________________________________ For Office Staff:  Number of people involved in session: 1 Was an Intern/ student involved with case: no
